# Patient Record
Sex: Female | Born: 1980 | Race: White | Hispanic: No | Marital: Married | Smoking: Never smoker
Health system: Southern US, Community
[De-identification: ages and names within clinical notes are randomized; demographics above are authoritative.]

## PROBLEM LIST (undated history)

## (undated) DIAGNOSIS — J189 Pneumonia, unspecified organism: Secondary | ICD-10-CM

## (undated) DIAGNOSIS — I471 Supraventricular tachycardia, unspecified: Secondary | ICD-10-CM

## (undated) DIAGNOSIS — D239 Other benign neoplasm of skin, unspecified: Secondary | ICD-10-CM

## (undated) DIAGNOSIS — Z8639 Personal history of other endocrine, nutritional and metabolic disease: Secondary | ICD-10-CM

## (undated) HISTORY — PX: LASIK: SHX215

## (undated) HISTORY — PX: TONSILLECTOMY: SUR1361

## (undated) HISTORY — DX: Other benign neoplasm of skin, unspecified: D23.9

## (undated) HISTORY — DX: Supraventricular tachycardia, unspecified: I47.10

## (undated) HISTORY — DX: Personal history of other endocrine, nutritional and metabolic disease: Z86.39

## (undated) HISTORY — PX: BACK SURGERY: SHX140

## (undated) HISTORY — DX: Supraventricular tachycardia: I47.1

---

## 2005-07-25 ENCOUNTER — Emergency Department: Payer: Self-pay | Admitting: Emergency Medicine

## 2005-07-25 ENCOUNTER — Other Ambulatory Visit: Payer: Self-pay

## 2005-08-01 ENCOUNTER — Ambulatory Visit: Payer: Self-pay

## 2005-11-27 ENCOUNTER — Ambulatory Visit: Payer: Self-pay | Admitting: Unknown Physician Specialty

## 2006-01-10 ENCOUNTER — Ambulatory Visit: Payer: Self-pay | Admitting: Obstetrics and Gynecology

## 2006-05-14 ENCOUNTER — Ambulatory Visit: Payer: Self-pay | Admitting: Neurological Surgery

## 2006-06-07 ENCOUNTER — Ambulatory Visit: Payer: Self-pay | Admitting: Obstetrics and Gynecology

## 2006-06-08 ENCOUNTER — Ambulatory Visit: Payer: Self-pay | Admitting: Obstetrics and Gynecology

## 2006-06-09 ENCOUNTER — Ambulatory Visit: Payer: Self-pay | Admitting: Obstetrics and Gynecology

## 2006-06-20 ENCOUNTER — Ambulatory Visit: Payer: Self-pay | Admitting: Obstetrics and Gynecology

## 2006-08-04 ENCOUNTER — Ambulatory Visit: Payer: Self-pay | Admitting: General Practice

## 2006-08-06 ENCOUNTER — Inpatient Hospital Stay: Payer: Self-pay | Admitting: Obstetrics and Gynecology

## 2006-09-08 ENCOUNTER — Ambulatory Visit: Payer: Self-pay | Admitting: General Practice

## 2007-02-14 ENCOUNTER — Encounter: Payer: Self-pay | Admitting: Obstetrics and Gynecology

## 2007-02-19 ENCOUNTER — Ambulatory Visit: Payer: Self-pay | Admitting: Internal Medicine

## 2007-03-21 ENCOUNTER — Encounter: Payer: Self-pay | Admitting: Maternal & Fetal Medicine

## 2007-04-11 ENCOUNTER — Encounter: Payer: Self-pay | Admitting: Maternal & Fetal Medicine

## 2007-04-30 ENCOUNTER — Observation Stay: Payer: Self-pay | Admitting: Obstetrics and Gynecology

## 2007-06-06 ENCOUNTER — Encounter: Payer: Self-pay | Admitting: Maternal & Fetal Medicine

## 2007-08-08 ENCOUNTER — Observation Stay: Payer: Self-pay | Admitting: Obstetrics and Gynecology

## 2007-08-23 ENCOUNTER — Inpatient Hospital Stay: Payer: Self-pay | Admitting: Obstetrics and Gynecology

## 2008-09-15 ENCOUNTER — Emergency Department: Payer: Self-pay | Admitting: Emergency Medicine

## 2009-05-01 ENCOUNTER — Other Ambulatory Visit: Payer: Self-pay | Admitting: Internal Medicine

## 2011-01-20 ENCOUNTER — Other Ambulatory Visit: Payer: Self-pay | Admitting: Emergency Medicine

## 2011-04-24 ENCOUNTER — Inpatient Hospital Stay: Payer: Self-pay

## 2011-12-08 ENCOUNTER — Ambulatory Visit: Payer: Self-pay | Admitting: General Practice

## 2012-08-01 ENCOUNTER — Ambulatory Visit: Payer: Self-pay | Admitting: General Practice

## 2013-04-03 LAB — HM PAP SMEAR: HM Pap smear: NEGATIVE

## 2014-05-11 ENCOUNTER — Ambulatory Visit: Payer: Self-pay | Admitting: Family Medicine

## 2014-05-12 ENCOUNTER — Other Ambulatory Visit: Payer: Self-pay | Admitting: Physician Assistant

## 2014-05-12 LAB — CBC WITH DIFFERENTIAL/PLATELET
Basophil #: 0 10*3/uL (ref 0.0–0.1)
Basophil %: 0.4 %
Eosinophil #: 0.1 10*3/uL (ref 0.0–0.7)
Eosinophil %: 1.7 %
HCT: 40.2 % (ref 35.0–47.0)
HGB: 13.3 g/dL (ref 12.0–16.0)
LYMPHS ABS: 1.7 10*3/uL (ref 1.0–3.6)
Lymphocyte %: 25 %
MCH: 31.8 pg (ref 26.0–34.0)
MCHC: 33.1 g/dL (ref 32.0–36.0)
MCV: 96 fL (ref 80–100)
MONO ABS: 0.6 x10 3/mm (ref 0.2–0.9)
Monocyte %: 8.3 %
NEUTROS ABS: 4.3 10*3/uL (ref 1.4–6.5)
Neutrophil %: 64.6 %
Platelet: 219 10*3/uL (ref 150–440)
RBC: 4.18 10*6/uL (ref 3.80–5.20)
RDW: 12.7 % (ref 11.5–14.5)
WBC: 6.7 10*3/uL (ref 3.6–11.0)

## 2014-05-12 LAB — HCG, QUANTITATIVE, PREGNANCY: Beta Hcg, Quant.: <1 m[IU]/mL — ABNORMAL LOW

## 2014-06-23 HISTORY — PX: DILATION AND CURETTAGE OF UTERUS: SHX78

## 2014-06-24 ENCOUNTER — Ambulatory Visit: Payer: Self-pay | Admitting: Obstetrics and Gynecology

## 2014-06-24 LAB — CBC
HCT: 39.1 % (ref 35.0–47.0)
HGB: 13 g/dL (ref 12.0–16.0)
MCH: 32 pg (ref 26.0–34.0)
MCHC: 33.4 g/dL (ref 32.0–36.0)
MCV: 96 fL (ref 80–100)
Platelet: 207 10*3/uL (ref 150–440)
RBC: 4.08 10*6/uL (ref 3.80–5.20)
RDW: 12.8 % (ref 11.5–14.5)
WBC: 7 10*3/uL (ref 3.6–11.0)

## 2014-06-26 LAB — PATHOLOGY REPORT

## 2014-10-23 NOTE — L&D Delivery Note (Cosign Needed)
Delivery Note At  a viable female was delivered via  (Presentation:LOA;  ).  APGAR:8 ,9 ; weight  .   Placenta status:delivered intact spontneously with Nuchal Cord x3:  with the following complications:none .  Cord pH: NA  Anesthesia:  none Episiotomy:  none Lacerations:  none Suture Repair: NA Est. Blood Loss (mL):    Mom to postpartum.  Baby to Couplet care / Skin to Skin.  Melody Burr 06/21/2015, 10:35 AM

## 2014-11-23 LAB — OB RESULTS CONSOLE HIV ANTIBODY (ROUTINE TESTING): HIV: NONREACTIVE

## 2014-11-23 LAB — OB RESULTS CONSOLE RPR: RPR: NONREACTIVE

## 2014-11-23 LAB — OB RESULTS CONSOLE VARICELLA ZOSTER ANTIBODY, IGG: Varicella: IMMUNE

## 2014-11-23 LAB — OB RESULTS CONSOLE ABO/RH: RH TYPE: POSITIVE

## 2014-11-23 LAB — OB RESULTS CONSOLE PLATELET COUNT: PLATELETS: 204 10*3/uL

## 2014-11-23 LAB — OB RESULTS CONSOLE HGB/HCT, BLOOD
HCT: 38 %
Hemoglobin: 12.6 g/dL

## 2014-11-23 LAB — OB RESULTS CONSOLE ANTIBODY SCREEN: Antibody Screen: NEGATIVE

## 2014-11-23 LAB — OB RESULTS CONSOLE GC/CHLAMYDIA
CHLAMYDIA, DNA PROBE: NEGATIVE
Gonorrhea: NEGATIVE

## 2014-11-23 LAB — OB RESULTS CONSOLE RUBELLA ANTIBODY, IGM: RUBELLA: IMMUNE

## 2015-01-18 ENCOUNTER — Encounter: Payer: Self-pay | Admitting: *Deleted

## 2015-01-18 ENCOUNTER — Encounter (INDEPENDENT_AMBULATORY_CARE_PROVIDER_SITE_OTHER): Payer: Self-pay

## 2015-01-18 ENCOUNTER — Encounter: Payer: Self-pay | Admitting: Cardiovascular Disease

## 2015-01-18 ENCOUNTER — Ambulatory Visit (INDEPENDENT_AMBULATORY_CARE_PROVIDER_SITE_OTHER): Payer: 59 | Admitting: Cardiovascular Disease

## 2015-01-18 VITALS — BP 103/66 | HR 74 | Ht 63.0 in | Wt 140.2 lb

## 2015-01-18 DIAGNOSIS — I471 Supraventricular tachycardia: Secondary | ICD-10-CM

## 2015-01-18 NOTE — Patient Instructions (Signed)
Your physician has requested that you have an echocardiogram. Echocardiography is a painless test that uses sound waves to create images of your heart. It provides your doctor with information about the size and shape of your heart and how well your heart's chambers and valves are working. This procedure takes approximately one hour. There are no restrictions for this procedure.  Follow up as needed 

## 2015-01-18 NOTE — Assessment & Plan Note (Signed)
She has known history of paroxysmal supraventricular tachycardia. However, the clinical burden overall seems to be low. Thus, I do not think therapy is warranted at the present time especially that most of her episodes respond to vagal maneuvers. I requested an echocardiogram to ensure no structural heart abnormalities. If SVT becomes more frequent requiring interventions, then treatment with a beta blocker can be considered as a last resort during pregnancy. Catheter ablation can be considered in the future again if the burden is more significant.

## 2015-01-18 NOTE — Progress Notes (Signed)
   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 She is referred for evaluation of paroxysmal supraventricular tachycardia. She reports prolonged history of SVT since 2007. She required adenosine on one time and no previous cardioversions. She can usually identify when she goes in SVT and in the majority of the time she is able to terminate the episodes with vagal maneuvers. These episodes usually gets worse when she is pregnant. She is currently [redacted] weeks pregnant. When she goes in SVT, the heart rate is usually at least 180 bpm. Otherwise she denies any chest pain or shortness of breath. She is not a smoker and does not drink alcohol. Family history is negative for premature coronary artery disease or arrhythmia.  Allergies  Allergen Reactions  . Latex      No current outpatient prescriptions on file prior to visit.   No current facility-administered medications on file prior to visit.     Past Medical History  Diagnosis Date  . Hx of hypercholesterolemia   . SVT (supraventricular tachycardia)      Past Surgical History  Procedure Laterality Date  . Back surgery    . Tonsillectomy       Family History  Problem Relation Age of Onset  . Hypertension Father   . Hyperlipidemia Father      History   Social History  . Marital Status: Married    Spouse Name: N/A  . Number of Children: N/A  . Years of Education: N/A   Occupational History  . Not on file.   Social History Main Topics  . Smoking status: Never Smoker   . Smokeless tobacco: Not on file  . Alcohol Use: No  . Drug Use: No  . Sexual Activity: Not on file   Other Topics Concern  . Not on file   Social History Narrative     ROS A 10 point review of system was performed. It is negative other than that mentioned in the history of present illness.   PHYSICAL EXAM   BP 103/66 mmHg  Pulse 74  Ht 5\' 3"  (1.6 m)  Wt 140 lb 4 oz (63.617 kg)  BMI 24.85  kg/m2 Constitutional: She is oriented to person, place, and time. She appears well-developed and well-nourished. No distress.  HENT: No nasal discharge.  Head: Normocephalic and atraumatic.  Eyes: Pupils are equal and round. No discharge.  Neck: Normal range of motion. Neck supple. No JVD present. No thyromegaly present.  Cardiovascular: Normal rate, regular rhythm, normal heart sounds. Exam reveals no gallop and no friction rub. No murmur heard.  Pulmonary/Chest: Effort normal and breath sounds normal. No stridor. No respiratory distress. She has no wheezes. She has no rales. She exhibits no tenderness.  Abdominal: Soft. Bowel sounds are normal. She exhibits no distension. There is no tenderness. There is no rebound and no guarding.  Musculoskeletal: Normal range of motion. She exhibits no edema and no tenderness.  Neurological: She is alert and oriented to person, place, and time. Coordination normal.  Skin: Skin is warm and dry. No rash noted. She is not diaphoretic. No erythema. No pallor.  Psychiatric: She has a normal mood and affect. Her behavior is normal. Judgment and thought content normal.     RFF:MBWGY  Rhythm  -Short PR  PRi = 116 Low voltage in precordial leads.   ABNORMAL    ASSESSMENT AND PLAN

## 2015-02-04 ENCOUNTER — Other Ambulatory Visit (INDEPENDENT_AMBULATORY_CARE_PROVIDER_SITE_OTHER): Payer: 59

## 2015-02-04 ENCOUNTER — Other Ambulatory Visit: Payer: Self-pay

## 2015-02-04 DIAGNOSIS — I471 Supraventricular tachycardia: Secondary | ICD-10-CM | POA: Diagnosis not present

## 2015-02-11 ENCOUNTER — Telehealth: Payer: Self-pay | Admitting: *Deleted

## 2015-02-11 NOTE — Telephone Encounter (Signed)
I spoke with the patient. 

## 2015-02-11 NOTE — Telephone Encounter (Signed)
Pt returning our call for results on test she did

## 2015-02-13 NOTE — Op Note (Signed)
PATIENT NAME:  KAMORA, VOSSLER MR#:  [id number] DATE OF BIRTH:  1981-10-23  DATE OF PROCEDURE:  06/24/2014  PREOPERATIVE DIAGNOSES:  1.  Missed abortion.  2.  Rh+ blood type.   POSTOPERATIVE DIAGNOSES: 1.  Missed abortion. 2.  Rh+ blood type.  OPERATIVE PROCEDURE: Suction dilatation and curettage.   SURGEON: Alanda Slim. Jaelyne Deeg, MD  FIRST ASSISTANT: None.   ANESTHESIA: General LMA.   INDICATIONS: The patient is a 34 year old married white female gravida 4, para 2 0-1-2 at 6+ weeks' gestation who presents for surgical management of missed abortion.   FINDINGS AT SURGERY: Revealed an anteverted uterus of top normal size. Uterus sounded to 9 cm. Tissue consistent with products of conception was removed.   DESCRIPTION OF PROCEDURE: The patient was brought to the operating room where she was placed in the supine position. General anesthesia with LMA was induced without difficulty. She was placed in the dorsal lithotomy position using the bumblebee stirrups. A Betadine abdominal, perineal, intravaginal prep and drape was performed in standard fashion. A latex free Foley catheter was used to drain 50 mL of urine from the bladder. A weighted speculum was placed into the vagina. A single-tooth tenaculum was placed the anterior lip of the cervix. The uterus was sounded to 9 cm. Hanks dilators were used, up to a #20 [place] caliber, to dilate the endocervical canal. This was followed by the use of a 10 French suction curette to remove tissue from the intrauterine cavity. Suction curettage was performed in standard technique with several passes. A sharp curette was likewise used following the suction curettage to verify that no significant tissue was left behind. Upon completion of the procedure, all instrumentation was removed from the vagina. The patient was then awakened, mobilized, and taken to the recovery room in satisfactory condition.   ESTIMATED BLOOD LOSS: 50 mL.   INTRAVENOUS FLUIDS: 450  mL.   URINE OUTPUT: 50 mL.   ____________________________ Alanda Slim. Tyiana Hill, MD mad:TT D: 06/24/2014 14:00:54 ET T: 06/24/2014 21:38:40 ET JOB#: [id number]  cc: Hassell Done A. Joie Reamer, MD, <Dictator> [facility] MD ELECTRONICALLY SIGNED 07/02/2014 8:07

## 2015-03-24 ENCOUNTER — Other Ambulatory Visit: Payer: Self-pay | Admitting: Obstetrics and Gynecology

## 2015-03-24 DIAGNOSIS — IMO0001 Reserved for inherently not codable concepts without codable children: Secondary | ICD-10-CM

## 2015-03-24 DIAGNOSIS — O358XX1 Maternal care for other (suspected) fetal abnormality and damage, fetus 1: Secondary | ICD-10-CM

## 2015-03-30 ENCOUNTER — Encounter: Payer: Self-pay | Admitting: *Deleted

## 2015-03-31 ENCOUNTER — Ambulatory Visit (INDEPENDENT_AMBULATORY_CARE_PROVIDER_SITE_OTHER): Payer: 59 | Admitting: Obstetrics and Gynecology

## 2015-03-31 ENCOUNTER — Ambulatory Visit: Payer: 59

## 2015-03-31 VITALS — BP 110/61 | HR 72 | Wt 152.8 lb

## 2015-03-31 DIAGNOSIS — O358XX1 Maternal care for other (suspected) fetal abnormality and damage, fetus 1: Secondary | ICD-10-CM

## 2015-03-31 DIAGNOSIS — Z3492 Encounter for supervision of normal pregnancy, unspecified, second trimester: Secondary | ICD-10-CM

## 2015-03-31 DIAGNOSIS — Z3482 Encounter for supervision of other normal pregnancy, second trimester: Secondary | ICD-10-CM

## 2015-03-31 DIAGNOSIS — Z131 Encounter for screening for diabetes mellitus: Secondary | ICD-10-CM | POA: Diagnosis not present

## 2015-03-31 DIAGNOSIS — Z23 Encounter for immunization: Secondary | ICD-10-CM

## 2015-03-31 DIAGNOSIS — IMO0001 Reserved for inherently not codable concepts without codable children: Secondary | ICD-10-CM

## 2015-03-31 LAB — POCT URINALYSIS DIPSTICK
Bilirubin, UA: NEGATIVE
Glucose, UA: NEGATIVE
Ketones, UA: NEGATIVE
LEUKOCYTES UA: NEGATIVE
NITRITE UA: NEGATIVE
PROTEIN UA: NEGATIVE
RBC UA: NEGATIVE
UROBILINOGEN UA: 0.2
pH, UA: 7.5

## 2015-03-31 NOTE — Progress Notes (Signed)
ROB & F/U U/S for reassess fetal kidneys- doing well w/o complaint; Glucola done, Tdap given; Blood consent signed; u/s reveals EFW 49%, with adequate AFI 13.3cm, rt kidney 5.3 and left kidney 6.9 (upper normal is 7.0 in 3rd trimester); reviewed ith patient, will alert pediatrician at delivery.

## 2015-03-31 NOTE — Patient Instructions (Signed)
  Place 24-38 weeks prenatal visit patient instructions here.

## 2015-04-01 LAB — HEMOGLOBIN AND HEMATOCRIT, BLOOD
HEMATOCRIT: 34.2 % (ref 34.0–46.6)
Hemoglobin: 11.4 g/dL (ref 11.1–15.9)

## 2015-04-01 LAB — GLUCOSE, 1 HOUR GESTATIONAL: Gestational Diabetes Screen: 121 mg/dL (ref 65–139)

## 2015-04-01 NOTE — Progress Notes (Signed)
Quick Note:  Please let her know she passed her glucola and no signs of anemia ______

## 2015-04-20 ENCOUNTER — Ambulatory Visit (INDEPENDENT_AMBULATORY_CARE_PROVIDER_SITE_OTHER): Payer: 59 | Admitting: Obstetrics and Gynecology

## 2015-04-20 ENCOUNTER — Encounter: Payer: Self-pay | Admitting: Obstetrics and Gynecology

## 2015-04-20 VITALS — BP 106/64 | HR 77 | Wt 155.5 lb

## 2015-04-20 DIAGNOSIS — Z3493 Encounter for supervision of normal pregnancy, unspecified, third trimester: Secondary | ICD-10-CM

## 2015-04-20 LAB — POCT URINALYSIS DIPSTICK
Bilirubin, UA: NEGATIVE
Glucose, UA: NEGATIVE
Ketones, UA: NEGATIVE
Leukocytes, UA: NEGATIVE
Nitrite, UA: NEGATIVE
Protein, UA: NEGATIVE
Urobilinogen, UA: 0.2
pH, UA: 7

## 2015-04-20 NOTE — Progress Notes (Signed)
[place], L hip pain

## 2015-04-20 NOTE — Progress Notes (Signed)
ROB- doing well, some soreness in left hip after falling out of bed on it 6 days ago but denies any other concerns; plans HC PP, plans breast-feeding x 6 weeks then switch to bottle as she has done with other infants; plans to name her "reagan".

## 2015-05-11 ENCOUNTER — Telehealth: Payer: Self-pay | Admitting: Obstetrics and Gynecology

## 2015-05-11 ENCOUNTER — Ambulatory Visit (INDEPENDENT_AMBULATORY_CARE_PROVIDER_SITE_OTHER): Payer: 59 | Admitting: Obstetrics and Gynecology

## 2015-05-11 ENCOUNTER — Encounter: Payer: Self-pay | Admitting: Obstetrics and Gynecology

## 2015-05-11 VITALS — BP 104/68 | HR 86 | Wt 157.5 lb

## 2015-05-11 DIAGNOSIS — Z3493 Encounter for supervision of normal pregnancy, unspecified, third trimester: Secondary | ICD-10-CM

## 2015-05-11 LAB — POCT URINALYSIS DIPSTICK
Bilirubin, UA: NEGATIVE
GLUCOSE UA: NEGATIVE
Ketones, UA: NEGATIVE
LEUKOCYTES UA: NEGATIVE
Nitrite, UA: NEGATIVE
PROTEIN UA: NEGATIVE
RBC UA: NEGATIVE
SPEC GRAV UA: 1.01
Urobilinogen, UA: 0.2
pH, UA: 6

## 2015-05-11 NOTE — Telephone Encounter (Signed)
Notified pt i spoke with MNB totally normal due to being checked today, pt voiced understanding

## 2015-05-11 NOTE — Progress Notes (Signed)
ROB- reassured about [facility] and cervical effects to date- PTL precautions reiterated. Cultures next visit.

## 2015-05-11 NOTE — Progress Notes (Signed)
Pt is having some braxton hicks, coming more frequent and is feeling lots of pelvic pressure States they can be so intense co workers are noticing

## 2015-05-11 NOTE — Telephone Encounter (Signed)
SP[place] .Marland KitchenMarland Kitchen [place]

## 2015-05-26 ENCOUNTER — Ambulatory Visit (INDEPENDENT_AMBULATORY_CARE_PROVIDER_SITE_OTHER): Payer: 59 | Admitting: Obstetrics and Gynecology

## 2015-05-26 ENCOUNTER — Encounter: Payer: Self-pay | Admitting: Obstetrics and Gynecology

## 2015-05-26 VITALS — BP 105/75 | HR 90 | Wt 162.3 lb

## 2015-05-26 DIAGNOSIS — Z3685 Encounter for antenatal screening for Streptococcus B: Secondary | ICD-10-CM

## 2015-05-26 DIAGNOSIS — Z113 Encounter for screening for infections with a predominantly sexual mode of transmission: Secondary | ICD-10-CM

## 2015-05-26 DIAGNOSIS — Z3493 Encounter for supervision of normal pregnancy, unspecified, third trimester: Secondary | ICD-10-CM

## 2015-05-26 DIAGNOSIS — Z36 Encounter for antenatal screening of mother: Secondary | ICD-10-CM

## 2015-05-26 LAB — POCT URINALYSIS DIPSTICK
BILIRUBIN UA: NEGATIVE
GLUCOSE UA: NEGATIVE
Ketones, UA: NEGATIVE
LEUKOCYTES UA: NEGATIVE
NITRITE UA: NEGATIVE
RBC UA: NEGATIVE
Spec Grav, UA: 1.01
Urobilinogen, UA: 0.2
pH, UA: 7

## 2015-05-26 NOTE — Progress Notes (Signed)
ROB- cultures obtained;no change from last visit.

## 2015-05-26 NOTE — Progress Notes (Signed)
Pt is still having some contractions, lots of pressure

## 2015-05-28 ENCOUNTER — Other Ambulatory Visit: Payer: Self-pay | Admitting: Obstetrics and Gynecology

## 2015-05-28 DIAGNOSIS — B951 Streptococcus, group B, as the cause of diseases classified elsewhere: Secondary | ICD-10-CM

## 2015-05-28 LAB — GC/CHLAMYDIA PROBE AMP
Chlamydia trachomatis, NAA: NEGATIVE
NEISSERIA GONORRHOEAE BY PCR: NEGATIVE

## 2015-05-28 LAB — STREP GP B NAA: STREP GROUP B AG: POSITIVE — AB

## 2015-06-01 ENCOUNTER — Encounter: Payer: Self-pay | Admitting: Obstetrics and Gynecology

## 2015-06-01 ENCOUNTER — Ambulatory Visit (INDEPENDENT_AMBULATORY_CARE_PROVIDER_SITE_OTHER): Payer: 59 | Admitting: Obstetrics and Gynecology

## 2015-06-01 VITALS — BP 99/69 | HR 88 | Wt 162.8 lb

## 2015-06-01 DIAGNOSIS — Z3493 Encounter for supervision of normal pregnancy, unspecified, third trimester: Secondary | ICD-10-CM

## 2015-06-01 LAB — POCT URINALYSIS DIPSTICK
Bilirubin, UA: NEGATIVE
Blood, UA: NEGATIVE
Glucose, UA: NEGATIVE
KETONES UA: NEGATIVE
Nitrite, UA: NEGATIVE
Protein, UA: NEGATIVE
SPEC GRAV UA: 1.01
Urobilinogen, UA: 0.2
pH, UA: 6.5

## 2015-06-01 NOTE — Progress Notes (Signed)
ROB- reviewed GBS+ and antibiotic use in labor- no questions,

## 2015-06-01 NOTE — Progress Notes (Signed)
ROB- having some contractions, pelvic pressure

## 2015-06-09 ENCOUNTER — Encounter: Payer: Self-pay | Admitting: Obstetrics and Gynecology

## 2015-06-09 ENCOUNTER — Ambulatory Visit (INDEPENDENT_AMBULATORY_CARE_PROVIDER_SITE_OTHER): Payer: 59 | Admitting: Obstetrics and Gynecology

## 2015-06-09 VITALS — BP 108/64 | HR 76 | Wt 163.0 lb

## 2015-06-09 DIAGNOSIS — Z3493 Encounter for supervision of normal pregnancy, unspecified, third trimester: Secondary | ICD-10-CM

## 2015-06-09 LAB — POCT URINALYSIS DIPSTICK
Bilirubin, UA: NEGATIVE
Blood, UA: NEGATIVE
Glucose, UA: NEGATIVE
KETONES UA: NEGATIVE
Leukocytes, UA: NEGATIVE
Nitrite, UA: NEGATIVE
PROTEIN UA: NEGATIVE
SPEC GRAV UA: 1.01
Urobilinogen, UA: 0.2
pH, UA: 6

## 2015-06-09 NOTE — Progress Notes (Signed)
ROB- pt is having some contractions, lots of pelvic pressure

## 2015-06-09 NOTE — Progress Notes (Signed)
ROB- not sleeping well, irreg [place], that are more painful at times; discussed cord blood donation and info given to review.

## 2015-06-15 ENCOUNTER — Encounter: Payer: Self-pay | Admitting: Obstetrics and Gynecology

## 2015-06-15 ENCOUNTER — Ambulatory Visit (INDEPENDENT_AMBULATORY_CARE_PROVIDER_SITE_OTHER): Payer: 59 | Admitting: Obstetrics and Gynecology

## 2015-06-15 VITALS — BP 106/62 | HR 82 | Wt 165.4 lb

## 2015-06-15 DIAGNOSIS — Z331 Pregnant state, incidental: Secondary | ICD-10-CM

## 2015-06-15 LAB — POCT URINALYSIS DIPSTICK
Bilirubin, UA: NEGATIVE
GLUCOSE UA: NEGATIVE
Ketones, UA: NEGATIVE
Leukocytes, UA: NEGATIVE
NITRITE UA: NEGATIVE
PH UA: 6.5
Protein, UA: NEGATIVE
RBC UA: NEGATIVE
Spec Grav, UA: 1.01
UROBILINOGEN UA: 0.2

## 2015-06-15 NOTE — Progress Notes (Signed)
ROB- labor precautions reiterated, scheduled for IOL 8/31 at 06:30a if not delivered by then, patient aware

## 2015-06-15 NOTE — Progress Notes (Signed)
ROB-contractions all night, pelvic pressure

## 2015-06-21 ENCOUNTER — Encounter: Payer: Self-pay | Admitting: *Deleted

## 2015-06-21 ENCOUNTER — Inpatient Hospital Stay
Admission: EM | Admit: 2015-06-21 | Discharge: 2015-06-23 | DRG: 775 | Disposition: A | Discharge status: Home / Self Care | Payer: 59 | Attending: Obstetrics and Gynecology | Admitting: Obstetrics and Gynecology

## 2015-06-21 DIAGNOSIS — Z3A39 39 weeks gestation of pregnancy: Secondary | ICD-10-CM | POA: Diagnosis present

## 2015-06-21 DIAGNOSIS — O99824 Streptococcus B carrier state complicating childbirth: Secondary | ICD-10-CM | POA: Diagnosis present

## 2015-06-21 DIAGNOSIS — Z8249 Family history of ischemic heart disease and other diseases of the circulatory system: Secondary | ICD-10-CM

## 2015-06-21 DIAGNOSIS — Z3493 Encounter for supervision of normal pregnancy, unspecified, third trimester: Secondary | ICD-10-CM

## 2015-06-21 DIAGNOSIS — Z8349 Family history of other endocrine, nutritional and metabolic diseases: Secondary | ICD-10-CM

## 2015-06-21 DIAGNOSIS — O479 False labor, unspecified: Secondary | ICD-10-CM | POA: Diagnosis present

## 2015-06-21 DIAGNOSIS — Z3490 Encounter for supervision of normal pregnancy, unspecified, unspecified trimester: Secondary | ICD-10-CM

## 2015-06-21 LAB — ABO/RH: ABO/RH(D): O POS

## 2015-06-21 LAB — CBC
HCT: 38 % (ref 35.0–47.0)
HEMOGLOBIN: 12.9 g/dL (ref 12.0–16.0)
MCH: 32.1 pg (ref 26.0–34.0)
MCHC: 34 g/dL (ref 32.0–36.0)
MCV: 94.3 fL (ref 80.0–100.0)
Platelets: 221 10*3/uL (ref 150–440)
RBC: 4.03 MIL/uL (ref 3.80–5.20)
RDW: 13.3 % (ref 11.5–14.5)
WBC: 14.2 10*3/uL — ABNORMAL HIGH (ref 3.6–11.0)

## 2015-06-21 LAB — TYPE AND SCREEN
ABO/RH(D): O POS
ANTIBODY SCREEN: NEGATIVE

## 2015-06-21 MED ORDER — BENZOCAINE-MENTHOL 20-0.5 % EX AERO: 1.0000 "application " | INHALATION_SPRAY | CUTANEOUS | Status: DC | PRN

## 2015-06-21 MED ORDER — SENNOSIDES-DOCUSATE SODIUM 8.6-50 MG PO TABS
2.0000 | ORAL_TABLET | ORAL | Status: DC
  Administered 2015-06-21: 2 via ORAL
  Filled 2015-06-21: qty 2

## 2015-06-21 MED ORDER — OXYCODONE-ACETAMINOPHEN 5-325 MG PO TABS
1.0000 | ORAL_TABLET | ORAL | Status: DC | PRN
  Administered 2015-06-21: 1 via ORAL
  Filled 2015-06-21: qty 1

## 2015-06-21 MED ORDER — OXYTOCIN 40 UNITS IN LACTATED RINGERS INFUSION - SIMPLE MED
62.5000 mL/h | INTRAVENOUS | Status: DC
  Administered 2015-06-21: 62.5 mL/h via INTRAVENOUS

## 2015-06-21 MED ORDER — LIDOCAINE HCL (PF) 1 % IJ SOLN
30.0000 mL | INTRAMUSCULAR | Status: DC | PRN
  Filled 2015-06-21: qty 30

## 2015-06-21 MED ORDER — LANOLIN HYDROUS EX OINT
TOPICAL_OINTMENT | CUTANEOUS | Status: DC | PRN
Start: 2015-06-21 — End: 2015-06-23

## 2015-06-21 MED ORDER — ACETAMINOPHEN 325 MG PO TABS: 650.0000 mg | ORAL_TABLET | ORAL | Status: DC | PRN

## 2015-06-21 MED ORDER — OXYTOCIN 40 UNITS IN LACTATED RINGERS INFUSION - SIMPLE MED
INTRAVENOUS | Status: AC
  Administered 2015-06-21: 62.5 mL/h via INTRAVENOUS
  Filled 2015-06-21: qty 1000

## 2015-06-21 MED ORDER — SIMETHICONE 80 MG PO CHEW: 80.0000 mg | CHEWABLE_TABLET | ORAL | Status: DC | PRN

## 2015-06-21 MED ORDER — SODIUM CHLORIDE 0.9 % IV SOLN
1.0000 g | INTRAVENOUS | Status: DC
  Filled 2015-06-21 (×7): qty 1000

## 2015-06-21 MED ORDER — OXYTOCIN 40 UNITS IN LACTATED RINGERS INFUSION - SIMPLE MED: 62.5000 mL/h | INTRAVENOUS | Status: DC | PRN

## 2015-06-21 MED ORDER — DIBUCAINE 1 % RE OINT
1.0000 "application " | TOPICAL_OINTMENT | RECTAL | Status: DC | PRN
  Administered 2015-06-21: 1 via RECTAL
  Filled 2015-06-21: qty 28

## 2015-06-21 MED ORDER — OXYCODONE-ACETAMINOPHEN 5-325 MG PO TABS: 2.0000 | ORAL_TABLET | ORAL | Status: DC | PRN

## 2015-06-21 MED ORDER — OXYCODONE-ACETAMINOPHEN 5-325 MG PO TABS: 1.0000 | ORAL_TABLET | ORAL | Status: DC | PRN

## 2015-06-21 MED ORDER — ONDANSETRON HCL 4 MG PO TABS: 4.0000 mg | ORAL_TABLET | ORAL | Status: DC | PRN

## 2015-06-21 MED ORDER — ONDANSETRON HCL 4 MG/2ML IJ SOLN: 4.0000 mg | Freq: Four times a day (QID) | INTRAMUSCULAR | Status: DC | PRN

## 2015-06-21 MED ORDER — LACTATED RINGERS IV SOLN
INTRAVENOUS | Status: DC
  Administered 2015-06-21: 11:00:00 via INTRAVENOUS

## 2015-06-21 MED ORDER — PRENATAL MULTIVITAMIN CH
1.0000 | ORAL_TABLET | Freq: Every day | ORAL | Status: DC
  Administered 2015-06-22: 1 via ORAL
  Filled 2015-06-21: qty 1

## 2015-06-21 MED ORDER — WITCH HAZEL-GLYCERIN EX PADS: 1.0000 "application " | MEDICATED_PAD | CUTANEOUS | Status: DC | PRN

## 2015-06-21 MED ORDER — SODIUM CHLORIDE 0.9 % IV SOLN
INTRAVENOUS | Status: AC
  Administered 2015-06-21: 2 g via INTRAVENOUS
  Filled 2015-06-21: qty 2000

## 2015-06-21 MED ORDER — CITRIC ACID-SODIUM CITRATE 334-500 MG/5ML PO SOLN: 30.0000 mL | ORAL | Status: DC | PRN

## 2015-06-21 MED ORDER — OXYTOCIN BOLUS FROM INFUSION
500.0000 mL | INTRAVENOUS | Status: DC
  Administered 2015-06-21: 500 mL via INTRAVENOUS

## 2015-06-21 MED ORDER — DIPHENHYDRAMINE HCL 25 MG PO CAPS: 25.0000 mg | ORAL_CAPSULE | Freq: Four times a day (QID) | ORAL | Status: DC | PRN

## 2015-06-21 MED ORDER — IBUPROFEN 600 MG PO TABS: 600.0000 mg | ORAL_TABLET | Freq: Four times a day (QID) | ORAL | Status: DC

## 2015-06-21 MED ORDER — FENTANYL CITRATE (PF) 100 MCG/2ML IJ SOLN: 50.0000 ug | INTRAMUSCULAR | Status: DC | PRN

## 2015-06-21 MED ORDER — ONDANSETRON HCL 4 MG/2ML IJ SOLN: 4.0000 mg | INTRAMUSCULAR | Status: DC | PRN

## 2015-06-21 MED ORDER — LACTATED RINGERS IV SOLN: 500.0000 mL | INTRAVENOUS | Status: DC | PRN

## 2015-06-21 MED ORDER — IBUPROFEN 800 MG PO TABS
800.0000 mg | ORAL_TABLET | Freq: Three times a day (TID) | ORAL | Status: DC
  Administered 2015-06-21 – 2015-06-23 (×5): 800 mg via ORAL
  Filled 2015-06-21 (×5): qty 1

## 2015-06-21 MED ORDER — SODIUM CHLORIDE 0.9 % IV SOLN
2.0000 g | Freq: Once | INTRAVENOUS | Status: AC
  Administered 2015-06-21: 2 g via INTRAVENOUS

## 2015-06-21 NOTE — H&P (Signed)
Obstetric History and Physical  LADAISHA PORTILLO is a 34 y.o. [id number] with IUP at [redacted]w[redacted]d presenting with contractions since 0330am. Patient states she has been having  regular, every 4 minutes contractions, minimal vaginal bleeding, intact membranes, with active fetal movement.    Prenatal Course Source of Care: [facility]  Pregnancy complications or risks:GBS +. H/O SVT  Prenatal labs and studies: ABO, Rh: O/Positive/-- (02/01 0000) Antibody: Negative (02/01 0000) Rubella: Immune (02/01 0000) RPR: Nonreactive (02/01 0000)  HBsAg:    HIV: Non-reactive (02/01 0000)  GBS:  1 hr Glucola  normal Genetic screening normal Anatomy US normal  Past Medical History  Diagnosis Date  . Hx of hypercholesterolemia   . SVT (supraventricular tachycardia)   . Abnormal fetal ultrasound     Past Surgical History  Procedure Laterality Date  . Back surgery    . Tonsillectomy    . Dilation and curettage of uterus  9 2015    OB History  Gravida Para Term Preterm AB SAB TAB Ectopic Multiple Living  5 1  1 1 1    2     # Outcome Date GA Lbr Len/2nd Weight Sex Delivery Anes PTL Lv  5 Current           4 Gravida 04/24/11    F Vag-Spont   Y  3 Gravida 08/24/07    M Vag-Spont   Y  2 Preterm 2007     Vag-Spont   FD  1 SAB               Social History   Social History  . Marital Status: Married    Spouse Name: N/A  . Number of Children: N/A  . Years of Education: N/A   Social History Main Topics  . Smoking status: Never Smoker   . Smokeless tobacco: Never Used  . Alcohol Use: No  . Drug Use: No  . Sexual Activity: Yes    Birth Control/ Protection: None   Other Topics Concern  . None   Social History Narrative    Family History  Problem Relation Age of Onset  . Hypertension Father   . Hyperlipidemia Father     Prescriptions prior to admission  Medication Sig Dispense Refill Last Dose  . Prenatal Vit-Fe Fumarate-FA (PRENATAL MULTIVITAMIN) TABS tablet Take 1 tablet by mouth daily  at 12 noon.   Taking    Allergies  Allergen Reactions  . Latex     Review of Systems: Negative except for what is mentioned in HPI.  Physical Exam: BP 117/73 mmHg  Pulse 104  Temp(Src) 98.1 F (36.7 C) (Oral)  Resp 22  LMP 09/17/2014 (LMP Unknown) GENERAL: Well-developed, well-nourished female in no acute distress.  LUNGS: Clear to auscultation bilaterally.  HEART: Regular rate and rhythm. ABDOMEN: Soft, nontender, nondistended, gravid. EXTREMITIES: Nontender, no edema, 2+ distal pulses. Cervical Exam:8/100/-1 with bulging bag;  FHT:  Baseline rate 145 bpm   Variability moderate  Accelerations present   Decelerations none Contractions: Every 4 mins   Pertinent Labs/Studies:   Results for orders placed or performed during the hospital encounter of 06/21/15 (from the past 24 hour(s))  CBC     Status: Abnormal   Collection Time: 06/21/15  9:10 AM  Result Value Ref Range   WBC 14.2 (H) 3.6 - 11.0 K/uL   RBC 4.03 3.80 - 5.20 MIL/uL   Hemoglobin 12.9 12.0 - 16.0 g/dL   HCT 38.0 35.0 - 47.0 %   MCV 94.3 80.0 - 100.0  fL   MCH 32.1 26.0 - 34.0 pg   MCHC 34.0 32.0 - 36.0 g/dL   RDW 13.3 11.5 - 14.5 %   Platelets 221 150 - 440 K/uL    Assessment : REED EIFERT is a 34 y.o. [id number] at [redacted]w[redacted]d being admitted for labor.  Plan: Labor: Expectant management.  Induction/Augmentation as needed, per protocol; 1st dose ampicillin infused; patient aware will not meet 4 hour window for full coverage.  FWB: Reassuring fetal heart tracing.  GBS positive Delivery plan: Hopeful for vaginal delivery  Melody Trudee Kuster, CNM [facility], [facility]

## 2015-06-22 ENCOUNTER — Encounter: Payer: 59 | Admitting: Obstetrics and Gynecology

## 2015-06-22 LAB — CBC
HCT: 37.3 % (ref 35.0–47.0)
HEMOGLOBIN: 12.4 g/dL (ref 12.0–16.0)
MCH: 31.9 pg (ref 26.0–34.0)
MCHC: 33.3 g/dL (ref 32.0–36.0)
MCV: 95.7 fL (ref 80.0–100.0)
Platelets: 225 10*3/uL (ref 150–440)
RBC: 3.9 MIL/uL (ref 3.80–5.20)
RDW: 13.2 % (ref 11.5–14.5)
WBC: 13.1 10*3/uL — AB (ref 3.6–11.0)

## 2015-06-22 LAB — RPR: RPR: NONREACTIVE

## 2015-06-22 NOTE — Progress Notes (Signed)
Post Partum Day 1 Subjective: no complaints, up ad lib and voiding  Objective: Blood pressure 98/52, pulse 66, temperature 98 F (36.7 C), temperature source Oral, resp. rate 18, height 5\' 3"  (1.6 m), weight 74.844 kg (165 lb), last menstrual period 09/17/2014, SpO2 98 %, unknown if currently breastfeeding.  Physical Exam:  General: alert, cooperative and appears stated age Lochia: appropriate Uterine Fundus: firm Incision: NA DVT Evaluation: No evidence of DVT seen on physical exam. Negative Homan's sign.   Recent Labs  06/21/15 0910 06/22/15 0512  HGB 12.9 12.4  HCT 38.0 37.3    Assessment/Plan: Plan for discharge tomorrow and Breastfeeding Infant feeding soley Breast;  LOS: 1 day   Saadiq Poche Burr 06/22/2015, 7:55 AM

## 2015-06-23 NOTE — Progress Notes (Signed)
All discharge instructions given to patient and she voices understanding of all instructions given. She will make her own f/u appt. No prescriptions were given. Pt discharged home with spouse escorted out by auxillary in wheelchair with infant in her arms

## 2015-06-23 NOTE — Discharge Summary (Signed)
Obstetric Discharge Summary Reason for Admission: onset of labor Prenatal Procedures: ultrasound Intrapartum Procedures: spontaneous vaginal delivery Postpartum Procedures: none Complications-Operative and Postpartum: none HEMOGLOBIN  Date Value Ref Range Status  06/22/2015 12.4 12.0 - 16.0 g/dL Final  11/23/2014 12.6 g/dL Final   HGB  Date Value Ref Range Status  06/24/2014 13.0 12.0-16.0 g/dL Final   HCT  Date Value Ref Range Status  06/22/2015 37.3 35.0 - 47.0 % Final  11/23/2014 38 % Final  06/24/2014 39.1 35.0-47.0 % Final   HEMATOCRIT  Date Value Ref Range Status  03/31/2015 34.2 34.0 - 46.6 % Final    Physical Exam:  General: alert, cooperative and appears stated age Lochia: appropriate Uterine Fundus: firm Incision: NA DVT Evaluation: No evidence of DVT seen on physical exam. Negative Homan's sign.  Discharge Diagnoses: Term Pregnancy-delivered  Discharge Information: Date: 06/23/2015 Activity: pelvic rest Diet: routine Medications: PNV and Ibuprofen Condition: stable Instructions: refer to practice specific booklet Discharge to: home   Newborn Data: Live born female  Birth Weight: 7 lb 15 oz (3600 g) APGAR: 8, 9  Home with mother.  Melody Burr 06/23/2015, 8:31 AM

## 2015-06-23 NOTE — Discharge Instructions (Signed)
Follow up sooner with fever, problems breathing, pain not helped by medications, severe depression( more than just baby blues, wanting to hurt yourself or the baby), severe bleeding ( saturating more than one pad an hour or large palm sized clots), no heavy lifting , no driving while taking narcotics, no douches, intercourse, tampons or enemas for 6 weeks  Vaginal Delivery During delivery, your health care provider will help you give birth to your baby. During a vaginal delivery, you will work to push the baby out of your vagina. However, before you can push your baby out, a few things need to happen. The opening of your uterus (cervix) has to soften, thin out, and open up (dilate) all the way to 10 cm. Also, your baby has to move down from the uterus into your vagina.  SIGNS OF LABOR  Your health care provider will first need to make sure you are in labor. Signs of labor include:   Passing what is called the mucous plug before labor begins. This is a small amount of blood-stained mucus.  Having regular, painful uterine contractions.   The time between contractions gets shorter.   The discomfort and pain gradually get more intense.  Contraction pains get worse when walking and do not go away when resting.   Your cervix becomes thinner (effacement) and dilates. BEFORE THE DELIVERY Once you are in labor and admitted into the hospital or care center, your health care provider may do the following:   Perform a complete physical exam.  Review any complications related to pregnancy or labor.  Check your blood pressure, pulse, temperature, and heart rate (vital signs).   Determine if, and when, the rupture of amniotic membranes occurred.  Do a vaginal exam (using a sterile glove and lubricant) to determine:   The position (presentation) of the baby. Is the baby's head presenting first (vertex) in the birth canal (vagina), or are the feet or buttocks first (breech)?   The level  (station) of the baby's head within the birth canal.   The effacement and dilatation of the cervix.   An electronic fetal monitor is usually placed on your abdomen when you first arrive. This is used to monitor your contractions and the baby's heart rate.  When the monitor is on your abdomen (external fetal monitor), it can only pick up the frequency and length of your contractions. It cannot tell the strength of your contractions.  If it becomes necessary for your health care provider to know exactly how strong your contractions are or to see exactly what the baby's heart rate is doing, an internal monitor may be inserted into your vagina and uterus. Your health care provider will discuss the benefits and risks of using an internal monitor and obtain your permission before inserting the device.  Continuous fetal monitoring may be needed if you have an epidural, are receiving certain medicines (such as oxytocin), or have pregnancy or labor complications.  An IV access tube may be placed into a vein in your arm to deliver fluids and medicines if necessary. THREE STAGES OF LABOR AND DELIVERY Normal labor and delivery is divided into three stages. First Stage This stage starts when you begin to contract regularly and your cervix begins to efface and dilate. It ends when your cervix is completely open (fully dilated). The first stage is the longest stage of labor and can last from 3 hours to 15 hours.  Several methods are available to help with labor pain. You and your  health care provider will decide which option is best for you. Options include:   Opioid medicines. These are strong pain medicines that you can get through your IV tube or as a shot into your muscle. These medicines lessen pain but do not make it go away completely.  Epidural. A medicine is given through a thin tube that is inserted in your back. The medicine numbs the lower part of your body and prevents any pain in that  area.  Paracervical pain medicine. This is an injection of an anesthetic on each side of your cervix.   You may request natural childbirth, which does not involve the use of pain medicines or an epidural during labor and delivery. Instead, you will use other things, such as breathing exercises, to help cope with the pain. Second Stage The second stage of labor begins when your cervix is fully dilated at 10 cm. It continues until you push your baby down through the birth canal and the baby is born. This stage can take only minutes or several hours.  The location of your baby's head as it moves through the birth canal is reported as a number called a station. If the baby's head has not started its descent, the station is described as being at minus 3 (-3). When your baby's head is at the zero station, it is at the middle of the birth canal and is engaged in the pelvis. The station of your baby helps indicate the progress of the second stage of labor.  When your baby is born, your health care provider may hold the baby with his or her head lowered to prevent amniotic fluid, mucus, and blood from getting into the baby's lungs. The baby's mouth and nose may be suctioned with a small bulb syringe to remove any additional fluid.  Your health care provider may then place the baby on your stomach. It is important to keep the baby from getting cold. To do this, the health care provider will dry the baby off, place the baby directly on your skin (with no blankets between you and the baby), and cover the baby with warm, dry blankets.   The umbilical cord is cut. Third Stage During the third stage of labor, your health care provider will deliver the placenta (afterbirth) and make sure your bleeding is under control. The delivery of the placenta usually takes about 5 minutes but can take up to 30 minutes. After the placenta is delivered, a medicine may be given either by IV or injection to help contract the  uterus and control bleeding. If you are planning to breastfeed, you can try to do so now. After you deliver the placenta, your uterus should contract and get very firm. If your uterus does not remain firm, your health care provider will massage it. This is important because the contraction of the uterus helps cut off bleeding at the site where the placenta was attached to your uterus. If your uterus does not contract properly and stay firm, you may continue to bleed heavily. If there is a lot of bleeding, medicines may be given to contract the uterus and stop the bleeding.  Document Released: 07/18/2008 Document Revised: 02/23/2014 Document Reviewed: 03/30/2013 [facility] Patient Information 2015 [place], [place]. This information is not intended to replace advice given to you by your health care provider. Make sure you discuss any questions you have with your health care provider.

## 2015-06-24 ENCOUNTER — Inpatient Hospital Stay: Admit: 2015-06-24 | Payer: 59 | Admitting: Obstetrics and Gynecology

## 2015-08-04 ENCOUNTER — Ambulatory Visit: Payer: 59 | Admitting: Obstetrics and Gynecology

## 2015-09-07 ENCOUNTER — Ambulatory Visit (INDEPENDENT_AMBULATORY_CARE_PROVIDER_SITE_OTHER): Payer: 59 | Admitting: Obstetrics and Gynecology

## 2015-09-07 ENCOUNTER — Encounter: Payer: Self-pay | Admitting: Obstetrics and Gynecology

## 2015-09-07 NOTE — Progress Notes (Signed)
  Subjective:     Angela Bush is a 34 y.o. female who presents for a postpartum visit. She is 10 weeks postpartum following a spontaneous vaginal delivery. I have fully reviewed the prenatal and intrapartum course. The delivery was at 6 gestational weeks. Outcome: spontaneous vaginal delivery. Anesthesia: IV sedation. Postpartum course has been uncomplicated. Baby's course has been complicated by fractured clavical. Baby is feeding by formula. Bleeding no bleeding. Bowel function is normal. Bladder function is normal. Patient is sexually active. Contraception method is condoms. Postpartum depression screening: negative.  The following portions of the patient's history were reviewed and updated as appropriate: allergies, current medications, past family history, past medical history, past social history, past surgical history and problem list.  Review of Systems A comprehensive review of systems was negative.   Objective:    BP 100/62 mmHg  Pulse 88  Ht 5\' 3"  (1.6 m)  Wt 148 lb 1.6 oz (67.178 kg)  BMI 26.24 kg/m2  LMP 08/18/2015 (Exact Date)  Breastfeeding? No  General:  alert, cooperative and appears stated age   Breasts:  inspection negative, no nipple discharge or bleeding, no masses or nodularity palpable  Lungs: clear to auscultation bilaterally  Heart:  regular rate and rhythm, S1, S2 normal, no murmur, click, rub or gallop  Abdomen: soft, non-tender; bowel sounds normal; no masses,  no organomegaly   Vulva:  normal  Vagina: normal vagina  Cervix:  multiparous appearance  Corpus: normal size, contour, position, consistency, mobility, non-tender  Adnexa:  normal adnexa and no mass, fullness, tenderness  Rectal Exam: Not performed.        Assessment:     11 weeks postpartum exam. Pap smear not done at today's visit.   Plan:    1. Contraception: NuvaRing vaginal inserts 3. Follow up in: 3 months for AE or as needed.

## 2015-09-07 NOTE — Patient Instructions (Signed)
  Place postpartum visit patient instructions here.  

## 2015-12-08 ENCOUNTER — Ambulatory Visit (INDEPENDENT_AMBULATORY_CARE_PROVIDER_SITE_OTHER): Payer: 59 | Admitting: Obstetrics and Gynecology

## 2015-12-08 ENCOUNTER — Encounter: Payer: Self-pay | Admitting: Obstetrics and Gynecology

## 2015-12-08 VITALS — BP 111/76 | HR 75 | Ht 63.0 in | Wt 139.4 lb

## 2015-12-08 DIAGNOSIS — Z01419 Encounter for gynecological examination (general) (routine) without abnormal findings: Secondary | ICD-10-CM

## 2015-12-08 NOTE — Progress Notes (Signed)
Subjective:   Angela Bush is a 35 y.o. [phone] Caucasian female here for a routine well-woman exam.  Patient's last menstrual period was 11/30/2015.    Current complaints: none PCP: Ronnald Ramp       doesn't desire labs  Social History: Sexual: heterosexual Marital Status: married Living situation: with family Occupation: Therapist, sports at [employer] Tobacco/alcohol: no tobacco use Illicit drugs: no history of illicit drug use  The following portions of the patient's history were reviewed and updated as appropriate: allergies, current medications, past family history, past medical history, past social history, past surgical history and problem list.  Past Medical History Past Medical History  Diagnosis Date  . Hx of hypercholesterolemia   . SVT (supraventricular tachycardia) ([place])   . Abnormal fetal ultrasound     Past Surgical History Past Surgical History  Procedure Laterality Date  . Back surgery    . Tonsillectomy    . Dilation and curettage of uterus  9 2015    Gynecologic History [id number]  Patient's last menstrual period was 11/30/2015. Contraception: condoms Last Pap: 2015. Results were: normal   Obstetric History OB History  Gravida Para Term Preterm AB SAB TAB Ectopic Multiple Living  5 2 1 1 1 1    0 3    # Outcome Date GA Lbr Len/2nd Weight Sex Delivery Anes PTL Lv  5 Term 06/21/15 [redacted]w[redacted]d 06:43 / 00:10 7 lb 15 oz (3.6 kg) F Vag-Spont None  Y  4 Gravida 04/24/11    F Vag-Spont   Y  3 Gravida 08/24/07    M Vag-Spont   Y  2 Preterm 2007     Vag-Spont   FD  1 SAB               Current Medications Current Outpatient Prescriptions on File Prior to Visit  Medication Sig Dispense Refill  . etonogestrel-ethinyl estradiol (NUVARING) 0.12-0.015 MG/24HR vaginal ring Place 1 each vaginally every 28 (twenty-eight) days. Reported on 12/08/2015    . Prenatal Vit-Fe Fumarate-FA (PRENATAL MULTIVITAMIN) TABS tablet Take 1 tablet by mouth daily at 12 noon. Reported on 12/08/2015     No  current facility-administered medications on file prior to visit.    Review of Systems Patient denies any headaches, blurred vision, shortness of breath, chest pain, abdominal pain, problems with bowel movements, urination, or intercourse.  Objective:  BP 111/76 mmHg  Pulse 75  Ht 5\' 3"  (1.6 m)  Wt 139 lb 6.4 oz (63.231 kg)  BMI 24.70 kg/m2  LMP 11/30/2015  Breastfeeding? No Physical Exam  General:  Well developed, well nourished, no acute distress. She is alert and oriented x3. Skin:  Warm and dry Neck:  Midline trachea, no thyromegaly or nodules Cardiovascular: Regular rate and rhythm, no murmur heard Lungs:  Effort normal, all lung fields clear to auscultation bilaterally Breasts:  No dominant palpable mass, retraction, or nipple discharge Abdomen:  Soft, non tender, no hepatosplenomegaly or masses Pelvic:  External genitalia is normal in appearance.  The vagina is normal in appearance. The cervix is bulbous, no CMT.  Thin prep pap is not done. Uterus is felt to be normal size, shape, and contour.  No adnexal masses or tenderness noted. Extremities:  No swelling or varicosities noted Psych:  She has a normal mood and affect  Assessment:   Healthy well-woman exam  Plan:  No labs indicated F/U 1 year for AE, or sooner if needed   [place] Kestner Rockney Ghee, CNM

## 2015-12-08 NOTE — Patient Instructions (Signed)
Place annual gynecologic exam patient instructions here.

## 2016-01-10 ENCOUNTER — Telehealth: Payer: Self-pay | Admitting: Obstetrics and Gynecology

## 2016-01-10 NOTE — Telephone Encounter (Signed)
When pt was here Angela Bush told her about a dermatologist. She can't remember the name. Can you give pt a call and let her know or I can call her.

## 2016-01-11 NOTE — Telephone Encounter (Signed)
Gave pt all names of dermatology

## 2016-05-02 DIAGNOSIS — L578 Other skin changes due to chronic exposure to nonionizing radiation: Secondary | ICD-10-CM | POA: Diagnosis not present

## 2016-05-02 DIAGNOSIS — D225 Melanocytic nevi of trunk: Secondary | ICD-10-CM | POA: Diagnosis not present

## 2016-05-02 DIAGNOSIS — I781 Nevus, non-neoplastic: Secondary | ICD-10-CM | POA: Diagnosis not present

## 2016-05-02 DIAGNOSIS — D229 Melanocytic nevi, unspecified: Secondary | ICD-10-CM | POA: Diagnosis not present

## 2016-05-02 DIAGNOSIS — L700 Acne vulgaris: Secondary | ICD-10-CM | POA: Diagnosis not present

## 2016-05-02 DIAGNOSIS — D485 Neoplasm of uncertain behavior of skin: Secondary | ICD-10-CM | POA: Diagnosis not present

## 2016-05-02 DIAGNOSIS — Z1283 Encounter for screening for malignant neoplasm of skin: Secondary | ICD-10-CM | POA: Diagnosis not present

## 2016-06-05 ENCOUNTER — Telehealth: Payer: 59 | Admitting: Family Medicine

## 2016-06-05 ENCOUNTER — Other Ambulatory Visit: Payer: Self-pay | Admitting: Family Medicine

## 2016-06-05 DIAGNOSIS — J0120 Acute ethmoidal sinusitis, unspecified: Secondary | ICD-10-CM

## 2016-06-05 MED ORDER — AZITHROMYCIN 250 MG PO TABS: ORAL_TABLET | ORAL | 0 refills | Status: DC

## 2016-06-05 NOTE — Progress Notes (Signed)
We are sorry that you are not feeling well.  Here is how we plan to help!  Based on what you have shared with me it looks like you have sinusitis.  Sinusitis is inflammation and infection in the sinus cavities of the head.  Based on your presentation I believe you most likely have Acute Viral Sinusitis.This is an infection most likely caused by a virus. There is not specific treatment for viral sinusitis other than to help you with the symptoms until the infection runs its course.  You may use an oral decongestant such as Mucinex D or if you have glaucoma or high blood pressure use plain Mucinex. Saline nasal spray help and can safely be used as often as needed for congestion, I have prescribed: Z pack take as directed. Increase daily fluids.  Some authorities believe that zinc sprays or the use of Echinacea may shorten the course of your symptoms.  Sinus infections are not as easily transmitted as other respiratory infection, however we still recommend that you avoid close contact with loved ones, especially the very young and elderly.  Remember to wash your hands thoroughly throughout the day as this is the number one way to prevent the spread of infection!  Home Care:  Only take medications as instructed by your medical team.  Complete the entire course of an antibiotic.  Do not take these medications with alcohol.  A steam or ultrasonic humidifier can help congestion.  You can place a towel over your head and breathe in the steam from hot water coming from a faucet.  Avoid close contacts especially the very young and the elderly.  Cover your mouth when you cough or sneeze.  Always remember to wash your hands.  Get Help Right Away If:  You develop worsening fever or sinus pain.  You develop a severe head ache or visual changes.  Your symptoms persist after you have completed your treatment plan.  Make sure you  Understand these instructions.  Will watch your condition.  Will  get help right away if you are not doing well or get worse.  Your e-visit answers were reviewed by a board certified advanced clinical practitioner to complete your personal care plan.  Depending on the condition, your plan could have included both over the counter or prescription medications.  If there is a problem please reply  once you have received a response from your provider.  Your safety is important to [place].  If you have drug allergies check your prescription carefully.    You can use MyChart to ask questions about today's visit, request a non-urgent call back, or ask for a work or school excuse for 24 hours related to this e-Visit. If it has been greater than 24 hours you will need to follow up with your provider, or enter a new e-Visit to address those concerns.  You will get an e-mail in the next two days asking about your experience.  I hope that your e-visit has been valuable and will speed your recovery. Thank you for using e-visits.

## 2016-06-07 ENCOUNTER — Telehealth: Payer: 59 | Admitting: Family

## 2016-06-07 DIAGNOSIS — J329 Chronic sinusitis, unspecified: Secondary | ICD-10-CM | POA: Diagnosis not present

## 2016-06-07 MED ORDER — FLUTICASONE PROPIONATE 50 MCG/ACT NA SUSP: 2.0000 | Freq: Every day | NASAL | 6 refills | Status: DC

## 2016-06-07 NOTE — Progress Notes (Signed)
We are sorry that you are not feeling well.  Here is how we plan to help!  Based on what you have shared with me it looks like you have sinusitis.  Sinusitis is inflammation and infection in the sinus cavities of the head.  Based on your presentation I believe you most likely have Acute Viral Sinusitis.This is an infection most likely caused by a virus. There is not specific treatment for viral sinusitis other than to help you with the symptoms until the infection runs its course.  You may use an oral decongestant such as Mucinex D or if you have glaucoma or high blood pressure use plain Mucinex. Saline nasal spray help and can safely be used as often as needed for congestion, I have prescribed: Fluticasone nasal spray two sprays in each nostril twice a day  You need to be on antibiotic for a full 72 hours before we can tell that it is not working.    Some authorities believe that zinc sprays or the use of Echinacea may shorten the course of your symptoms.  Sinus infections are not as easily transmitted as other respiratory infection, however we still recommend that you avoid close contact with loved ones, especially the very young and elderly.  Remember to wash your hands thoroughly throughout the day as this is the number one way to prevent the spread of infection!  Home Care:  Only take medications as instructed by your medical team.  Complete the entire course of an antibiotic.  Do not take these medications with alcohol.  A steam or ultrasonic humidifier can help congestion.  You can place a towel over your head and breathe in the steam from hot water coming from a faucet.  Avoid close contacts especially the very young and the elderly.  Cover your mouth when you cough or sneeze.  Always remember to wash your hands.  Get Help Right Away If:  You develop worsening fever or sinus pain.  You develop a severe head ache or visual changes.  Your symptoms persist after you have  completed your treatment plan.  Make sure you  Understand these instructions.  Will watch your condition.  Will get help right away if you are not doing well or get worse.  Your e-visit answers were reviewed by a board certified advanced clinical practitioner to complete your personal care plan.  Depending on the condition, your plan could have included both over the counter or prescription medications.  If there is a problem please reply  once you have received a response from your provider.  Your safety is important to [place].  If you have drug allergies check your prescription carefully.    You can use MyChart to ask questions about today's visit, request a non-urgent call back, or ask for a work or school excuse for 24 hours related to this e-Visit. If it has been greater than 24 hours you will need to follow up with your provider, or enter a new e-Visit to address those concerns.  You will get an e-mail in the next two days asking about your experience.  I hope that your e-visit has been valuable and will speed your recovery. Thank you for using e-visits.

## 2016-06-08 ENCOUNTER — Encounter: Payer: Self-pay | Admitting: Physician Assistant

## 2016-06-08 ENCOUNTER — Ambulatory Visit: Payer: Self-pay | Admitting: Physician Assistant

## 2016-06-08 VITALS — BP 100/70 | HR 80 | Temp 99.0°F

## 2016-06-08 DIAGNOSIS — J209 Acute bronchitis, unspecified: Secondary | ICD-10-CM

## 2016-06-08 MED ORDER — LEVOFLOXACIN 500 MG PO TABS: 500.0000 mg | ORAL_TABLET | Freq: Every day | ORAL | 0 refills | Status: DC

## 2016-06-08 MED ORDER — HYDROCOD POLST-CPM POLST ER 10-8 MG/5ML PO SUER: 5.0000 mL | Freq: Two times a day (BID) | ORAL | 0 refills | Status: DC | PRN

## 2016-06-08 MED ORDER — ALBUTEROL SULFATE HFA 108 (90 BASE) MCG/ACT IN AERS: 2.0000 | INHALATION_SPRAY | Freq: Four times a day (QID) | RESPIRATORY_TRACT | 0 refills | Status: DC | PRN

## 2016-06-08 NOTE — Progress Notes (Addendum)
S: C/o cough and congestion with ear pain for 4 days, + fever of 102 this am, , chest is sore from coughing, + fever, chills, , cough is dry and hacking; keeping pt awake at night;  denies cardiac type chest pain or sob, v/d, abd pain, called e-visit and was given zpack, is not helping, feels that she is worsening; Remainder ros neg  O: vitals w elevated temp, others wnl, nad but pt appears ill;  tms clear, throat injected, neck supple no lymph, lungs with rhonchi left lower lung, cv rrr, neuro intact, cough is dry/hacking  A:  Acute bronchitis   P:  rx medication:  levaquin 500mg  qd, albuterol inhaler, tussionex 138ml nr, stop zpack; use otc meds, tylenol or motrin as needed for fever/chills, return if not better in 3 -5 days, return earlier if worsening Pt called office, is better but still having trouble breathing, ?if could get a steroid, sent to [place]

## 2016-06-13 MED ORDER — METHYLPREDNISOLONE 4 MG PO TBPK: ORAL_TABLET | ORAL | 0 refills | Status: DC

## 2016-06-13 NOTE — Addendum Note (Signed)
Addended by: Versie Starks on: 06/13/2016 11:31 AM   Modules accepted: Orders

## 2016-08-14 ENCOUNTER — Ambulatory Visit
Admission: RE | Admit: 2016-08-14 | Discharge: 2016-08-14 | Disposition: A | Discharge status: Home / Self Care | Payer: 59 | Source: Ambulatory Visit | Attending: Physician Assistant | Admitting: Physician Assistant

## 2016-08-14 ENCOUNTER — Ambulatory Visit: Payer: Self-pay | Admitting: Physician Assistant

## 2016-08-14 ENCOUNTER — Encounter: Payer: Self-pay | Admitting: Physician Assistant

## 2016-08-14 VITALS — BP 94/76 | HR 80 | Temp 98.5°F

## 2016-08-14 DIAGNOSIS — R918 Other nonspecific abnormal finding of lung field: Secondary | ICD-10-CM | POA: Insufficient documentation

## 2016-08-14 DIAGNOSIS — R05 Cough: Secondary | ICD-10-CM | POA: Insufficient documentation

## 2016-08-14 DIAGNOSIS — R059 Cough, unspecified: Secondary | ICD-10-CM

## 2016-08-14 MED ORDER — METHYLPREDNISOLONE 4 MG PO TBPK: ORAL_TABLET | ORAL | 0 refills | Status: DC

## 2016-08-14 MED ORDER — AZITHROMYCIN 250 MG PO TABS: ORAL_TABLET | ORAL | 0 refills | Status: DC

## 2016-08-14 MED ORDER — FLUTICASONE-SALMETEROL 115-21 MCG/ACT IN AERO: 2.0000 | INHALATION_SPRAY | Freq: Two times a day (BID) | RESPIRATORY_TRACT | 12 refills | Status: DC

## 2016-08-14 MED ORDER — IPRATROPIUM-ALBUTEROL 0.5-2.5 (3) MG/3ML IN SOLN: 3.0000 mL | Freq: Once | RESPIRATORY_TRACT | Status: AC

## 2016-08-14 NOTE — Progress Notes (Addendum)
S: C/o cough and congestion with wheezing and chest pain, chest is sore from coughing, no fever, +chills,  cough is dry and hacking; keeping pt awake at night;  denies cardiac type chest pain or sob, v/d, abd pain Remainder ros neg  O: vitals wnl, nad, tms clear, throat injected, neck supple no lymph, lungs with wheezing, cv rrr, neuro intact, svn duoneb given, air movement increased still wheezing  A:  Acute bronchitis   P:  rx medication:   Zpack, medrol dose pack, advair inhaler, cxr today; use otc meds, tylenol or motrin as needed for fever/chills, return if not better in 3 -5 days, return earlier if worsening  Pt called and is not feeling better, cxr + interstitial pneumonia, called in levaquin, pt to stop zpack

## 2016-08-18 MED ORDER — LEVOFLOXACIN 500 MG PO TABS: 500.0000 mg | ORAL_TABLET | Freq: Every day | ORAL | 0 refills | Status: DC

## 2016-08-18 NOTE — Addendum Note (Signed)
Addended by: Versie Starks on: 08/18/2016 09:13 AM   Modules accepted: Orders

## 2016-11-15 ENCOUNTER — Ambulatory Visit: Payer: Self-pay | Admitting: Physician Assistant

## 2016-11-15 ENCOUNTER — Ambulatory Visit
Admission: RE | Admit: 2016-11-15 | Discharge: 2016-11-15 | Disposition: A | Discharge status: Home / Self Care | Payer: 59 | Source: Ambulatory Visit | Attending: Physician Assistant | Admitting: Physician Assistant

## 2016-11-15 ENCOUNTER — Encounter: Payer: Self-pay | Admitting: Physician Assistant

## 2016-11-15 VITALS — BP 100/70 | HR 83 | Temp 98.5°F

## 2016-11-15 DIAGNOSIS — R059 Cough, unspecified: Secondary | ICD-10-CM

## 2016-11-15 DIAGNOSIS — R05 Cough: Secondary | ICD-10-CM

## 2016-11-15 DIAGNOSIS — J209 Acute bronchitis, unspecified: Secondary | ICD-10-CM

## 2016-11-15 MED ORDER — METHYLPREDNISOLONE 4 MG PO TBPK: ORAL_TABLET | ORAL | 0 refills | Status: DC

## 2016-11-15 MED ORDER — LEVOFLOXACIN 500 MG PO TABS: 500.0000 mg | ORAL_TABLET | Freq: Every day | ORAL | 0 refills | Status: DC

## 2016-11-15 NOTE — Progress Notes (Signed)
S: C/o cough and congestion with wheezing and chest pain, chest is sore from coughing, denies fever, chills; states cough is dry and hacking; keeping pt awake at night;  denies cardiac type chest pain or sob, v/d, abd pain, did not get repeat cxr after having pneumonia in october Remainder ros neg  O: vitals wnl, nad, tms clear, throat injected, neck supple no lymph, lungs c t a, cv rrr, neuro intact, cough is dry  A:  Acute bronchitis   P:  rx medication: zpack, prednisone 30mg  qd x 3d;  use otc meds, tylenol or motrin as needed for fever/chills, return if not better in 3 -5 days, return earlier if worsening

## 2016-11-16 NOTE — Progress Notes (Signed)
Spoke with patient this morning informed her of cxr and recommendation. Patient acknowledge understanding

## 2016-11-25 ENCOUNTER — Telehealth: Payer: 59 | Admitting: Nurse Practitioner

## 2016-11-25 DIAGNOSIS — J111 Influenza due to unidentified influenza virus with other respiratory manifestations: Secondary | ICD-10-CM | POA: Diagnosis not present

## 2016-11-25 MED ORDER — OSELTAMIVIR PHOSPHATE 75 MG PO CAPS: 75.0000 mg | ORAL_CAPSULE | Freq: Two times a day (BID) | ORAL | 0 refills | Status: DC

## 2016-11-25 NOTE — Progress Notes (Signed)

## 2016-12-11 ENCOUNTER — Telehealth: Payer: Self-pay | Admitting: Physician Assistant

## 2016-12-11 ENCOUNTER — Ambulatory Visit
Admission: RE | Admit: 2016-12-11 | Discharge: 2016-12-11 | Disposition: A | Discharge status: Home / Self Care | Payer: 59 | Source: Ambulatory Visit | Attending: Physician Assistant | Admitting: Physician Assistant

## 2016-12-11 ENCOUNTER — Encounter: Payer: Self-pay | Admitting: Physician Assistant

## 2016-12-11 DIAGNOSIS — J189 Pneumonia, unspecified organism: Secondary | ICD-10-CM

## 2016-12-11 DIAGNOSIS — R918 Other nonspecific abnormal finding of lung field: Secondary | ICD-10-CM | POA: Insufficient documentation

## 2016-12-11 NOTE — Telephone Encounter (Signed)
Sent order for cxr to [facility] road/outpatient imaging

## 2016-12-11 NOTE — Telephone Encounter (Signed)
Spoke with patient to inform her that order was sent for CXR

## 2016-12-11 NOTE — Telephone Encounter (Signed)
Order for repeat cxr, pt had pneumonia (interstitial) 11-15-16

## 2016-12-12 ENCOUNTER — Telehealth: Payer: Self-pay | Admitting: Physician Assistant

## 2016-12-14 ENCOUNTER — Ambulatory Visit (INDEPENDENT_AMBULATORY_CARE_PROVIDER_SITE_OTHER): Payer: 59 | Admitting: Family Medicine

## 2016-12-14 ENCOUNTER — Telehealth: Payer: Self-pay | Admitting: Internal Medicine

## 2016-12-14 ENCOUNTER — Encounter: Payer: Self-pay | Admitting: Family Medicine

## 2016-12-14 VITALS — BP 120/60 | HR 80 | Ht 63.0 in | Wt 135.0 lb

## 2016-12-14 DIAGNOSIS — J189 Pneumonia, unspecified organism: Secondary | ICD-10-CM

## 2016-12-14 DIAGNOSIS — R938 Abnormal findings on diagnostic imaging of other specified body structures: Secondary | ICD-10-CM | POA: Diagnosis not present

## 2016-12-14 DIAGNOSIS — R9389 Abnormal findings on diagnostic imaging of other specified body structures: Secondary | ICD-10-CM

## 2016-12-14 NOTE — Addendum Note (Signed)
Addended by: Fredderick Severance on: 12/14/2016 04:20 PM   Modules accepted: Orders

## 2016-12-14 NOTE — Telephone Encounter (Signed)
Patient has new patient referral from pcp for recurrent pneumonia and abnormal cxr.  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  Patient added to the wait list .  Is there anywhere that kasa may be able to see her sooner than next available with ram 03-27?

## 2016-12-14 NOTE — Progress Notes (Signed)
Name: Angela Bush   MRN: [id number]    DOB: Apr 15, 1981   Date:12/14/2016       Progress Note  Subjective  Chief Complaint  Chief Complaint  Patient presents with  . Establish Care  . Follow-up    pneumonia- "have a spot on lung"    Patient has had recurent community acqurire pneumonia bilateral/right side.  Time course September 2017/ three treatable episodes.  Sx : cough/productive, fever, dyspnea, and chest discomfort.   Pneumonia  She complains of chest tightness, cough, difficulty breathing, shortness of breath, sputum production and wheezing. There is no frequent throat clearing, hemoptysis or hoarse voice. Primary symptoms comments: History thereof. This is a recurrent problem. The current episode started more than 1 month ago. The problem occurs intermittently. The problem has been waxing and waning. The cough is productive of sputum and productive of purulent sputum. Associated symptoms include chest pain, a fever, malaise/fatigue and myalgias. Pertinent negatives include no ear pain, headaches, heartburn, nasal congestion, orthopnea, PND, postnasal drip, rhinorrhea, sneezing, sore throat, sweats, trouble swallowing or weight loss. Her symptoms are alleviated by beta-agonist, steroid inhaler and oral steroids (levoquin). She reports moderate (recurrence) improvement on treatment. There are no known risk factors for lung disease. Her past medical history is significant for pneumonia.    No problem-specific Assessment & Plan notes found for this encounter.   Past Medical History:  Diagnosis Date  . Abnormal fetal ultrasound   . Hx of hypercholesterolemia   . SVT (supraventricular tachycardia) ([facility])     Past Surgical History:  Procedure Laterality Date  . BACK SURGERY    . DILATION AND CURETTAGE OF UTERUS  9 2015  . TONSILLECTOMY      Family History  Problem Relation Age of Onset  . Hypertension Father   . Hyperlipidemia Father     Social History   Social  History  . Marital status: Married    Spouse name: N/A  . Number of children: N/A  . Years of education: N/A   Occupational History  . Not on file.   Social History Main Topics  . Smoking status: Never Smoker  . Smokeless tobacco: Never Used  . Alcohol use No  . Drug use: No  . Sexual activity: Yes    Birth control/ protection: Condom   Other Topics Concern  . Not on file   Social History Narrative  . No narrative on file    Allergies  Allergen Reactions  . Latex     Outpatient Medications Prior to Visit  Medication Sig Dispense Refill  . albuterol (PROVENTIL HFA;VENTOLIN HFA) 108 (90 Base) MCG/ACT inhaler Inhale 2 puffs into the lungs every 6 (six) hours as needed for wheezing or shortness of breath. (Patient not taking: Reported on 12/14/2016) 1 Inhaler 0  . fluticasone-salmeterol (ADVAIR HFA) 115-21 MCG/ACT inhaler Inhale 2 puffs into the lungs 2 (two) times daily. (Patient not taking: Reported on 12/14/2016) 1 Inhaler 12  . chlorpheniramine-HYDROcodone (TUSSIONEX PENNKINETIC ER) 10-8 MG/5ML SUER Take 5 mLs by mouth every 12 (twelve) hours as needed for cough. (Patient not taking: Reported on 11/15/2016) 150 mL 0  . fluticasone (FLONASE) 50 MCG/ACT nasal spray Place 2 sprays into both nostrils daily. 16 g 6  . levofloxacin (LEVAQUIN) 500 MG tablet Take 1 tablet (500 mg total) by mouth daily. (Patient not taking: Reported on 11/15/2016) 10 tablet 0  . levofloxacin (LEVAQUIN) 500 MG tablet Take 1 tablet (500 mg total) by mouth daily. 10 tablet 0  .  methylPREDNISolone (MEDROL DOSEPAK) 4 MG TBPK tablet Take 6 pills on day one then decrease by 1 pill each day 21 tablet 0  . oseltamivir (TAMIFLU) 75 MG capsule Take 1 capsule (75 mg total) by mouth 2 (two) times daily. 10 capsule 0   Facility-Administered Medications Prior to Visit  Medication Dose Route Frequency Provider Last Rate Last Dose  . ipratropium-albuterol (DUONEB) 0.5-2.5 (3) MG/3ML nebulizer solution 3 mL  3 mL  Nebulization Once Versie Starks, PA-C        Review of Systems  Constitutional: Positive for fever and malaise/fatigue. Negative for chills and weight loss.  HENT: Negative for ear discharge, ear pain, hoarse voice, postnasal drip, rhinorrhea, sneezing, sore throat and trouble swallowing.   Eyes: Negative for blurred vision.  Respiratory: Positive for cough, sputum production, shortness of breath and wheezing. Negative for hemoptysis.   Cardiovascular: Positive for chest pain. Negative for palpitations, leg swelling and PND.  Gastrointestinal: Negative for abdominal pain, blood in stool, constipation, diarrhea, heartburn, melena and nausea.  Genitourinary: Negative for dysuria, frequency, hematuria and urgency.  Musculoskeletal: Positive for myalgias. Negative for back pain, joint pain and neck pain.  Skin: Negative for rash.  Neurological: Negative for dizziness, tingling, sensory change, focal weakness and headaches.  Endo/Heme/Allergies: Negative for environmental allergies and polydipsia. Does not bruise/bleed easily.  Psychiatric/Behavioral: Negative for depression and suicidal ideas. The patient is not nervous/anxious and does not have insomnia.      Objective  Vitals:   12/14/16 0833  BP: 120/60  Pulse: 80  Weight: 135 lb (61.2 kg)  Height: 5\' 3"  (1.6 m)    Physical Exam  Constitutional: She is well-developed, well-nourished, and in no distress. No distress.  HENT:  Head: Normocephalic and atraumatic.  Right Ear: External ear normal.  Left Ear: External ear normal.  Nose: Nose normal.  Mouth/Throat: Oropharynx is clear and moist.  Eyes: Conjunctivae and EOM are normal. Pupils are equal, round, and reactive to light. Right eye exhibits no discharge. Left eye exhibits no discharge.  Neck: Normal range of motion. Neck supple. No JVD present. No thyromegaly present.  Cardiovascular: Normal rate, regular rhythm, normal heart sounds and intact distal pulses.  Exam reveals no  gallop and no friction rub.   No murmur heard. Pulmonary/Chest: Effort normal and breath sounds normal.  Abdominal: Soft. Bowel sounds are normal. She exhibits no mass. There is no tenderness. There is no guarding.  Musculoskeletal: Normal range of motion. She exhibits no edema.  Lymphadenopathy:    She has no cervical adenopathy.  Neurological: She is alert. She has normal reflexes.  Skin: Skin is warm and dry. She is not diaphoretic.  Psychiatric: Mood and affect normal.      Assessment & Plan  Problem List Items Addressed This Visit    None    Visit Diagnoses    Recurrent pneumonia    -  Primary   ? interstial    Relevant Orders   Ambulatory referral to Pulmonology   Abnormal chest x-ray       Relevant Orders   Ambulatory referral to Pulmonology      No orders of the defined types were placed in this encounter.     Dr. [facility]  12/14/16

## 2016-12-14 NOTE — Telephone Encounter (Signed)
Unfortunately not. That is his next available.

## 2016-12-14 NOTE — Telephone Encounter (Signed)
See previous note discussed with patient.

## 2016-12-20 ENCOUNTER — Ambulatory Visit
Admission: RE | Admit: 2016-12-20 | Discharge: 2016-12-20 | Disposition: A | Discharge status: Home / Self Care | Payer: 59 | Source: Ambulatory Visit | Attending: Family Medicine | Admitting: Family Medicine

## 2016-12-20 DIAGNOSIS — R938 Abnormal findings on diagnostic imaging of other specified body structures: Secondary | ICD-10-CM | POA: Diagnosis not present

## 2016-12-20 DIAGNOSIS — R918 Other nonspecific abnormal finding of lung field: Secondary | ICD-10-CM | POA: Insufficient documentation

## 2016-12-20 DIAGNOSIS — J189 Pneumonia, unspecified organism: Secondary | ICD-10-CM | POA: Diagnosis not present

## 2016-12-20 DIAGNOSIS — R9389 Abnormal findings on diagnostic imaging of other specified body structures: Secondary | ICD-10-CM

## 2016-12-20 MED ORDER — IOPAMIDOL (ISOVUE-300) INJECTION 61%
75.0000 mL | Freq: Once | INTRAVENOUS | Status: AC | PRN
  Administered 2016-12-20: 75 mL via INTRAVENOUS

## 2016-12-25 ENCOUNTER — Encounter: Payer: Self-pay | Admitting: Internal Medicine

## 2016-12-25 ENCOUNTER — Ambulatory Visit (INDEPENDENT_AMBULATORY_CARE_PROVIDER_SITE_OTHER): Payer: 59 | Admitting: Internal Medicine

## 2016-12-25 ENCOUNTER — Other Ambulatory Visit
Admission: RE | Admit: 2016-12-25 | Discharge: 2016-12-25 | Disposition: A | Discharge status: Home / Self Care | Payer: 59 | Source: Ambulatory Visit | Attending: Internal Medicine | Admitting: Internal Medicine

## 2016-12-25 VITALS — BP 118/70 | HR 86 | Wt 136.0 lb

## 2016-12-25 DIAGNOSIS — R0600 Dyspnea, unspecified: Secondary | ICD-10-CM

## 2016-12-25 DIAGNOSIS — R9389 Abnormal findings on diagnostic imaging of other specified body structures: Secondary | ICD-10-CM

## 2016-12-25 DIAGNOSIS — R938 Abnormal findings on diagnostic imaging of other specified body structures: Secondary | ICD-10-CM | POA: Diagnosis not present

## 2016-12-25 LAB — CBC WITH DIFFERENTIAL/PLATELET
Basophils Absolute: 0 10*3/uL (ref 0–0.1)
Basophils Relative: 1 %
Eosinophils Absolute: 0.2 10*3/uL (ref 0–0.7)
Eosinophils Relative: 3 %
HEMATOCRIT: 41.1 % (ref 35.0–47.0)
HEMOGLOBIN: 14.1 g/dL (ref 12.0–16.0)
LYMPHS ABS: 1.4 10*3/uL (ref 1.0–3.6)
LYMPHS PCT: 21 %
MCH: 32.2 pg (ref 26.0–34.0)
MCHC: 34.4 g/dL (ref 32.0–36.0)
MCV: 93.8 fL (ref 80.0–100.0)
MONO ABS: 0.5 10*3/uL (ref 0.2–0.9)
MONOS PCT: 7 %
NEUTROS ABS: 4.7 10*3/uL (ref 1.4–6.5)
NEUTROS PCT: 68 %
Platelets: 224 10*3/uL (ref 150–440)
RBC: 4.38 MIL/uL (ref 3.80–5.20)
RDW: 12.9 % (ref 11.5–14.5)
WBC: 6.9 10*3/uL (ref 3.6–11.0)

## 2016-12-25 LAB — COMPREHENSIVE METABOLIC PANEL
ALBUMIN: 4.6 g/dL (ref 3.5–5.0)
ALT: 15 U/L (ref 14–54)
AST: 23 U/L (ref 15–41)
Alkaline Phosphatase: 80 U/L (ref 38–126)
Anion gap: 6 (ref 5–15)
BUN: 7 mg/dL (ref 6–20)
CHLORIDE: 102 mmol/L (ref 101–111)
CO2: 30 mmol/L (ref 22–32)
CREATININE: 0.59 mg/dL (ref 0.44–1.00)
Calcium: 9.5 mg/dL (ref 8.9–10.3)
GFR calc Af Amer: >60 mL/min (ref >60)
Glucose, Bld: 72 mg/dL (ref 65–99)
POTASSIUM: 3.6 mmol/L (ref 3.5–5.1)
SODIUM: 138 mmol/L (ref 135–145)
Total Bilirubin: 0.6 mg/dL (ref 0.3–1.2)
Total Protein: 7.7 g/dL (ref 6.5–8.1)

## 2016-12-25 LAB — PREGNANCY, URINE: PREG TEST UR: NEGATIVE

## 2016-12-25 NOTE — Progress Notes (Signed)
[facility] Consultation      Date: 12/25/2016,   MRN# [id number] Angela Bush 12/31/1980 Code Status:  Code Status History    Date Active Date Inactive Code Status Order ID Comments User Context   06/21/2015  6:48 PM 06/23/2015  1:54 PM Full Code [id number]  Melody N Burr, CNM Inpatient   06/21/2015  1:13 PM 06/21/2015  6:48 PM Full Code [id number]  Jana M Grindheim, RN Inpatient   06/21/2015  9:07 AM 06/21/2015  1:13 PM Full Code [id number]  Jana M Grindheim, RN Inpatient     Hosp day:@LENGTHOFSTAYDAYS@ Referring MD: @ATDPROV@     PCP:      AdmissionWeight: 136 lb (61.7 kg)                 CurrentWeight: 136 lb (61.7 kg) Angela M Kawashima is a 36 y.o. old female seen in consultation for abnormal CT chest at the request of Dr. Jones.     CHIEF COMPLAINT:   I had pneumonia   HISTORY OF PRESENT ILLNESS   36 yo pleasant white female seen today for several reasons  1.patient has had recurrent bouts of URI and chest congestion  First episode 06/2016-given z pak then levaquin and prednisone-got better within days Second episode 08/2016 dx with pneumonia given levaquin and steroids got better within days 3rd episode Jan 2018-CXr shows abnormal opacity RT lung given levaquin and steroids-got better within days  2.Patient with RUL opacity/nodular appearance  RLL bronchiectasis with probable previous inflammation/infection  CT chest reviewed with patient: there are also liver and spleen findings with and adenopathy, also RUL nodular opacity  Currently, patient has no acute resp issues at this time. No signs of infection at this time No lower ext swelling no skin changes  2.patient   PAST MEDICAL HISTORY   Past Medical History:  Diagnosis Date  . Abnormal fetal ultrasound   . Hx of hypercholesterolemia   . SVT (supraventricular tachycardia) (HCC)      SURGICAL HISTORY   Past Surgical History:  Procedure Laterality Date  . BACK SURGERY    .  DILATION AND CURETTAGE OF UTERUS  9 2015  . TONSILLECTOMY       FAMILY HISTORY   Family History  Problem Relation Age of Onset  . Hypertension Father   . Hyperlipidemia Father      SOCIAL HISTORY   Social History  Substance Use Topics  . Smoking status: Never Smoker  . Smokeless tobacco: Never Used  . Alcohol use No     MEDICATIONS    Home Medication:  Current Outpatient Rx  . Order #: [id number]: Normal  . Order #: [id number]: Normal    Current Medication:  Current Outpatient Prescriptions:  .  albuterol (PROVENTIL HFA;VENTOLIN HFA) 108 (90 Base) MCG/ACT inhaler, Inhale 2 puffs into the lungs every 6 (six) hours as needed for wheezing or shortness of breath. (Patient not taking: Reported on 12/14/2016), Disp: 1 Inhaler, Rfl: 0 .  fluticasone-salmeterol (ADVAIR HFA) 115-21 MCG/ACT inhaler, Inhale 2 puffs into the lungs 2 (two) times daily. (Patient not taking: Reported on 12/14/2016), Disp: 1 Inhaler, Rfl: 12  Current Facility-Administered Medications:  .  ipratropium-albuterol (DUONEB) 0.5-2.5 (3) MG/3ML nebulizer solution 3 mL, 3 mL, Nebulization, Once, Susan W Fisher, PA-C    ALLERGIES   Latex     REVIEW OF SYSTEMS   Review of Systems  Constitutional: Negative for chills, diaphoresis, fever, malaise/fatigue and weight loss.  HENT: Negative for congestion and hearing loss.     Eyes: Negative for blurred vision and double vision.  Respiratory: Negative for cough, hemoptysis, sputum production, shortness of breath and wheezing.   Cardiovascular: Negative for chest pain, palpitations and orthopnea.  Gastrointestinal: Negative for abdominal pain, blood in stool, constipation, diarrhea, heartburn, nausea and vomiting.  Genitourinary: Negative for dysuria and urgency.  Musculoskeletal: Negative for back pain, myalgias and neck pain.  Skin: Negative for rash.  Neurological: Negative for dizziness, tingling, tremors, weakness and headaches.    Endo/Heme/Allergies: Does not bruise/bleed easily.  Psychiatric/Behavioral: Negative for depression, substance abuse and suicidal ideas.  All other systems reviewed and are negative.    VS: BP 118/70 (BP Location: Right Arm, Cuff Size: Normal)   Pulse 86   Wt 136 lb (61.7 kg)   LMP 11/27/2016   SpO2 99%   BMI 24.09 kg/m      PHYSICAL EXAM  Physical Exam  Constitutional: She is oriented to person, place, and time. She appears well-developed and well-nourished. No distress.  HENT:  Head: Normocephalic and atraumatic.  Mouth/Throat: No oropharyngeal exudate.  Eyes: EOM are normal. Pupils are equal, round, and reactive to light. No scleral icterus.  Neck: Normal range of motion. Neck supple.  Cardiovascular: Normal rate, regular rhythm and normal heart sounds.   No murmur heard. Pulmonary/Chest: No stridor. No respiratory distress. She has no wheezes.  Abdominal: Soft. Bowel sounds are normal.  Musculoskeletal: Normal range of motion. She exhibits no edema.  Neurological: She is alert and oriented to person, place, and time. No cranial nerve deficit.  Skin: Skin is warm. She is not diaphoretic.  Psychiatric: She has a normal mood and affect.           IMAGING    Dg Chest 2 View  Result Date: 12/11/2016 CLINICAL DATA:  Follow-up pneumonia. EXAM: CHEST  2 VIEW COMPARISON:  Multiple chest x-rays since October 2013 FINDINGS: Pulmonary opacity seen in the right mid lung, new since 2013. It is less conspicuous when compared October 2017 but more prominent when compared January 2018. No other interval changes or acute abnormalities. IMPRESSION: Right mid lung opacity as above with a waxing and waning course. Recommend a short-term follow-up in 6 weeks. If it does not resolve on that study, recommend CT imaging. Electronically Signed   By: David  Williams III M.D   On: 12/11/2016 18:42   Ct Chest W Contrast  Result Date: 12/20/2016 CLINICAL DATA:  Recurrent pneumonia.  Abnormal  chest radiograph. EXAM: CT CHEST WITH CONTRAST TECHNIQUE: Multidetector CT imaging of the chest was performed during intravenous contrast administration. CONTRAST:  75mL ISOVUE-300 IOPAMIDOL (ISOVUE-300) INJECTION 61% COMPARISON:  Chest radiograph 12/11/2016. FINDINGS: Cardiovascular: Vascular structures are unremarkable. Heart size normal. No pericardial effusion. Mediastinum/Nodes: No pathologically enlarged mediastinal, hilar or axillary lymph nodes. Esophagus is grossly unremarkable. Lungs/Pleura: Upper and midlung zone predominant peribronchovascular nodularity and mild architectural distortion. Fissural nodularity is noted as well. No pleural fluid. Airway is unremarkable. Upper Abdomen: There are multiple low-attenuation lesions in the liver which measure up to approximately 10 mm in the right hepatic lobe. Visualized portions of the adrenal glands and kidneys are unremarkable. There are multiple low-attenuation lesions in the spleen, measuring up to 1.5 cm. Visualized portions of the pancreas and stomach are grossly unremarkable. Small lymph nodes are seen in the upper abdomen. Musculoskeletal: Scoliosis. 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 No worrisome lytic or sclerotic lesions. IMPRESSION: Upper/ mid lung zone predominant perilymphatic nodularity together with multiple low-attenuation lesions in the liver and spleen, most   consistent with sarcoid. Electronically Signed   By: Melinda  Blietz M.D.   On: 12/20/2016 13:21    I have Independently reviewed images of 12/20/16 CT can 12/25/2016 Interpretation:RUL opacity, RLL bronchiectasis   ASSESSMENT/PLAN   36 yo white female with recurrent bouts of chest congestion/acute Bronchitis with abnormal CT scan with RUL opacity There is a possibility that this could be inflammatory process ie. Sarcoidosis At this time, patient still in process of diagnostic evaluation and work up  At this time, it would be beneficial for patient to obtain PET  scan for further assessment  1.will obtain PET scan 2.check CBC, CMP 3.check ACE levels and calcium levels 4.check Pregnancy test   Follow up after tests completed     Patient/Family are satisfied with Plan of action and management. All questions answered  Angela Bush, M.D.  [facility]  Medical Director [facility] Medical Director [facility]          

## 2016-12-25 NOTE — Patient Instructions (Addendum)
Check the following PET SCAN CBC CMP ACE level Calcium Levels Pregnancy test

## 2016-12-26 ENCOUNTER — Other Ambulatory Visit: Payer: Self-pay | Admitting: *Deleted

## 2016-12-26 LAB — ANGIOTENSIN CONVERTING ENZYME: ANGIOTENSIN-CONVERTING ENZYME: 55 U/L (ref 14–82)

## 2016-12-28 ENCOUNTER — Ambulatory Visit
Admission: RE | Admit: 2016-12-28 | Discharge: 2016-12-28 | Disposition: A | Discharge status: Home / Self Care | Payer: 59 | Source: Ambulatory Visit | Attending: Internal Medicine | Admitting: Internal Medicine

## 2016-12-28 ENCOUNTER — Telehealth: Payer: Self-pay | Admitting: *Deleted

## 2016-12-28 DIAGNOSIS — R911 Solitary pulmonary nodule: Secondary | ICD-10-CM | POA: Diagnosis not present

## 2016-12-28 DIAGNOSIS — R160 Hepatomegaly, not elsewhere classified: Secondary | ICD-10-CM

## 2016-12-28 DIAGNOSIS — R0600 Dyspnea, unspecified: Secondary | ICD-10-CM | POA: Insufficient documentation

## 2016-12-28 DIAGNOSIS — R938 Abnormal findings on diagnostic imaging of other specified body structures: Secondary | ICD-10-CM | POA: Diagnosis not present

## 2016-12-28 DIAGNOSIS — R9389 Abnormal findings on diagnostic imaging of other specified body structures: Secondary | ICD-10-CM

## 2016-12-28 LAB — GLUCOSE, CAPILLARY: Glucose-Capillary: 85 mg/dL (ref 65–99)

## 2016-12-28 MED ORDER — FLUDEOXYGLUCOSE F - 18 (FDG) INJECTION
11.9800 | Freq: Once | INTRAVENOUS | Status: AC | PRN
  Administered 2016-12-28: 11.98 via INTRAVENOUS

## 2016-12-28 NOTE — Telephone Encounter (Signed)
Patient's case reviewed in weekly cancer case conference per request of Dr. Mortimer Fries. Noted appearance of lung, liver, and spleen on CT and PET are suggestive of sarcoid. Ace level is WNL. Recommendation is for Ultrasound guided liver biopsy for suspected sarcoid. If nondiagnostic, surgical biopsy of lung is also an option.   Patient has been notified of these recommendations and is in agreement with this plan.   This note will be forwarded to Dr. Mortimer Fries and I will assist with coordination as needed.

## 2016-12-28 NOTE — Telephone Encounter (Signed)
Thank you for letting us know.  If we need to do something let me know.

## 2016-12-29 NOTE — Telephone Encounter (Signed)
Order placed for US guided BX for liver. Nothing further needed.

## 2016-12-29 NOTE — Addendum Note (Signed)
Addended by: Oscar La R on: 12/29/2016 04:30 PM   Modules accepted: Orders

## 2017-01-01 ENCOUNTER — Telehealth: Payer: Self-pay | Admitting: *Deleted

## 2017-01-01 DIAGNOSIS — H52223 Regular astigmatism, bilateral: Secondary | ICD-10-CM | POA: Diagnosis not present

## 2017-01-01 NOTE — Telephone Encounter (Signed)
After discussion with Angela Bush in [name], notified patient of arrival time for US guided liver biopsy tomorrow 01/02/17 at 8:30am. Reviewed npo after midnight, no blood thinning medications including OTC medications, and needing to have driver at discharge. Patient verbalizes understanding.

## 2017-01-02 ENCOUNTER — Other Ambulatory Visit: Payer: Self-pay | Admitting: Internal Medicine

## 2017-01-02 ENCOUNTER — Ambulatory Visit
Admission: RE | Admit: 2017-01-02 | Discharge: 2017-01-02 | Disposition: A | Discharge status: Home / Self Care | Payer: 59 | Source: Ambulatory Visit | Attending: Internal Medicine | Admitting: Internal Medicine

## 2017-01-02 DIAGNOSIS — R160 Hepatomegaly, not elsewhere classified: Secondary | ICD-10-CM | POA: Diagnosis not present

## 2017-01-02 DIAGNOSIS — K769 Liver disease, unspecified: Secondary | ICD-10-CM | POA: Diagnosis not present

## 2017-01-02 LAB — PROTIME-INR
INR: 1.05
Prothrombin Time: 13.7 seconds (ref 11.4–15.2)

## 2017-01-02 LAB — APTT: aPTT: 44 seconds — ABNORMAL HIGH (ref 24–36)

## 2017-01-02 MED ORDER — SODIUM CHLORIDE 0.9 % IV SOLN: INTRAVENOUS | Status: DC

## 2017-01-02 NOTE — Discharge Instructions (Signed)
Liver Biopsy, Care After  Refer to this sheet in the next few weeks. These instructions provide you with information on caring for yourself after your procedure. Your health care provider may also give you more specific instructions. Your treatment has been planned according to current medical practices, but problems sometimes occur. Call your health care provider if you have any problems or questions after your procedure.  What can I expect after the procedure?  After your procedure, it is typical to have the following:  · A small amount of discomfort in the area where the biopsy was done and in the right shoulder or shoulder blade.  · A small amount of bruising around the area where the biopsy was done and on the skin over the liver.  · Sleepiness and fatigue for the rest of the day.    Follow these instructions at home:  · Rest at home for 1-2 days or as directed by your health care provider.  · Have a friend or family member stay with you for at least 24 hours.  · Because of the medicines used during the procedure, you should not do the following things in the first 24 hours:  ? Drive.  ? Use machinery.  ? Be responsible for the care of other people.  ? Sign legal documents.  ? Take a bath or shower.  · There are many different ways to close and cover an incision, including stitches, skin glue, and adhesive strips. Follow your health care provider's instructions on:  ? Incision care.  ? Bandage (dressing) changes and removal.  ? Incision closure removal.  · Do not drink alcohol in the first week.  · Do not lift more than 5 pounds or play contact sports for 2 weeks after this test.  · Take medicines only as directed by your health care provider. Do not take medicine containing aspirin or non-steroidal anti-inflammatory medicines such as ibuprofen for 1 week after this test.  · It is your responsibility to get your test results.  Contact a health care provider if:  · You have increased bleeding from an incision  that results in more than a small spot of blood.  · You have redness, swelling, or increasing pain in any incisions.  · You notice a discharge or a bad smell coming from any of your incisions.  · You have a fever or chills.  Get help right away if:  · You develop swelling, bloating, or pain in your abdomen.  · You become dizzy or faint.  · You develop a rash.  · You are nauseous or vomit.  · You have difficulty breathing, feel short of breath, or feel faint.  · You develop chest pain.  · You have problems with your speech or vision.  · You have trouble balancing or moving your arms or legs.  This information is not intended to replace advice given to you by your health care provider. Make sure you discuss any questions you have with your health care provider.  Document Released: 04/28/2005 Document Revised: 03/16/2016 Document Reviewed: 12/05/2013  Elsevier Interactive Patient Education © 2017 [employer]

## 2017-01-02 NOTE — OR Nursing (Signed)
U/S confirmed to Dr Annamaria Boots liver lesions too small to obtain sample. Dr Annamaria Boots discussed findings with Dr Mortimer Fries and pt. Pt discharged post [place].

## 2017-01-02 NOTE — OR Nursing (Signed)
Dr Annamaria Boots discussed case with pt, sending pt to [place] for look for lesions prior to further prepping.

## 2017-01-03 ENCOUNTER — Ambulatory Visit (INDEPENDENT_AMBULATORY_CARE_PROVIDER_SITE_OTHER): Payer: 59 | Admitting: Cardiothoracic Surgery

## 2017-01-03 ENCOUNTER — Encounter: Payer: Self-pay | Admitting: Cardiothoracic Surgery

## 2017-01-03 VITALS — BP 102/72 | HR 82 | Temp 98.6°F | Ht 63.0 in | Wt 137.2 lb

## 2017-01-03 DIAGNOSIS — D869 Sarcoidosis, unspecified: Secondary | ICD-10-CM | POA: Diagnosis not present

## 2017-01-03 NOTE — Patient Instructions (Signed)
Please give me a call in two days so this could give me enough time to speak with the Radiologist. Hopefully they will agree in doing a percutaneous CT Guided Biopsy.

## 2017-01-03 NOTE — Progress Notes (Signed)
Patient ID: Angela Bush, female   DOB: 08/25/1981, 36 y.o.   MRN: [id number]  Chief Complaint  Patient presents with  . New Patient (Initial Visit)    Lung Nodules    Referred By Dr. Patricia Pesa Reason for Referral right lower lobe mass  HPI Location, Quality, Duration, Severity, Timing, Context, Modifying Factors, Associated Signs and Symptoms.  Angela Bush is a 36 y.o. female.  She was in her usual state of health until last winter when she developed an episode of cough with sputum production. She was placed on Levaquin and steroids and improved. She had similar episode several more times over the winterspring of this year. Each time she was treated with Levaquin and steroids and felt significantly better. She had a chest x-ray made which revealed a possible rectal nightly nodular densities in both lung fields and this ultimately led to a CT scan and referral to Dr. Patricia Pesa. The CT scan shows a coalescence of multiple pulmonary nodules in the right lower lobe with scattered nodularity throughout the lungs that was felt to be most consistent with sarcoid. In addition there are multiple lesions in the liver and spleen that again were thought to be related to sarcoid. She had an attempt at a percutaneous liver biopsy which was unsuccessful secondary to the inability to see the lesions on the basis of the ultrasound. Therefore she is referred to me for possible lung biopsy. The patient is completely asymptomatic at this point in time. She states that she's had no cough or shortness of breath. 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 She is reluctant to undergo any invasive procedure may take away from her job at this point in time. She denied any fevers or chills. Her angiotensin-converting enzyme levels are normal. Her calcium levels are normal.   Past Medical History:   Diagnosis Date  . Abnormal fetal ultrasound   . Hx of hypercholesterolemia   . SVT (supraventricular tachycardia) (HCC)     Past Surgical History:  Procedure Laterality Date  . BACK SURGERY    . DILATION AND CURETTAGE OF UTERUS  9 2015  . TONSILLECTOMY      Family History  Problem Relation Age of Onset  . Hypertension Father   . Hyperlipidemia Father     Social History Social History  Substance Use Topics  . Smoking status: Never Smoker  . Smokeless tobacco: Never Used  . Alcohol use No    Allergies  Allergen Reactions  . Latex     Current Outpatient Prescriptions  Medication Sig Dispense Refill  . albuterol (PROVENTIL HFA;VENTOLIN HFA) 108 (90 Base) MCG/ACT inhaler Inhale 2 puffs into the lungs every 6 (six) hours as needed for wheezing or shortness of breath. 1 Inhaler 0  . fluticasone-salmeterol (ADVAIR HFA) 115-21 MCG/ACT inhaler Inhale 2 puffs into the lungs 2 (two) times daily. 1 Inhaler 12   Current Facility-Administered Medications  Medication Dose Route Frequency Provider Last Rate Last Dose  . 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  She denied any fevers or chills or hemoptysis.  Blood  pressure 102/72, pulse 82, temperature 98.6 F (37 C), temperature source Oral, height 5\' 3"  (1.6 m), weight 137 lb 3.2 oz (62.2 kg), last menstrual period 12/15/2016, not currently breastfeeding.  Physical Exam CONSTITUTIONAL:  Pleasant, well-developed, well-nourished, and in no acute distress. EYES: Pupils equal and reactive to light, Sclera non-icteric EARS, NOSE, MOUTH AND THROAT:  The oropharynx was clear.  Dentition is good repair.  Oral mucosa pink and moist. LYMPH NODES:  Lymph nodes in  the neck and axillae were normal RESPIRATORY:  Lungs were clear.  Normal respiratory effort without pathologic use of accessory muscles of respiration CARDIOVASCULAR: Heart was regular without murmurs.  There were no carotid bruits. GI: The abdomen was soft, nontender, and nondistended. There were no palpable masses. There was no hepatosplenomegaly. There were normal bowel sounds in all quadrants. GU:  Rectal deferred.   MUSCULOSKELETAL:  Normal muscle strength and tone.  No clubbing or cyanosis.   SKIN:  There were no pathologic skin lesions.  There were no nodules on palpation.  There are multiple scars on her back from her prior surgery NEUROLOGIC:  Sensation is normal.  Cranial nerves are grossly intact. PSYCH:  Oriented to person, place and time.  Mood and affect are normal.  Data Reviewed Chest x-ray and CT scan.  I have personally reviewed the patient's imaging, laboratory findings and medical records.    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 I do not believe she has lymphoma. I explained to her in detail the indications and risks of thoracoscopy possible thoracotomy for lung biopsy. She would like to avoid that if at all possible. I discussed with her the potential for trying a percutaneous lung biopsy. She understands that the yield may be significantly lower but that the risks may also be lower.      Plan    I told her that I would try to get one of our radiologist to review her films again and see if a percutaneous biopsy may be possible. She will contact my office on Friday to go over those results.       Nestor Lewandowsky, MD 01/03/2017, 12:10 PM

## 2017-01-12 ENCOUNTER — Encounter
Admission: RE | Admit: 2017-01-12 | Discharge: 2017-01-12 | Disposition: A | Discharge status: Home / Self Care | Payer: 59 | Source: Ambulatory Visit | Attending: Internal Medicine | Admitting: Internal Medicine

## 2017-01-12 DIAGNOSIS — Z8249 Family history of ischemic heart disease and other diseases of the circulatory system: Secondary | ICD-10-CM | POA: Diagnosis not present

## 2017-01-12 DIAGNOSIS — Z79899 Other long term (current) drug therapy: Secondary | ICD-10-CM | POA: Insufficient documentation

## 2017-01-12 DIAGNOSIS — R918 Other nonspecific abnormal finding of lung field: Secondary | ICD-10-CM | POA: Insufficient documentation

## 2017-01-12 DIAGNOSIS — Z01810 Encounter for preprocedural cardiovascular examination: Secondary | ICD-10-CM | POA: Insufficient documentation

## 2017-01-12 DIAGNOSIS — I471 Supraventricular tachycardia: Secondary | ICD-10-CM | POA: Diagnosis not present

## 2017-01-12 DIAGNOSIS — Z9889 Other specified postprocedural states: Secondary | ICD-10-CM | POA: Diagnosis not present

## 2017-01-12 HISTORY — DX: Pneumonia, unspecified organism: J18.9

## 2017-01-12 NOTE — Patient Instructions (Signed)
  Your procedure is scheduled on: 01-17-17 (Wednesday) Report to Same Day Surgery 2nd floor medical mall [facility] Entrance-take elevator on left to 2nd floor.  Check in with surgery information desk.) To find out your arrival time please call [phone] between 1PM - 3PM on 01-16-17 (Tuesday)  Remember: Instructions that are not followed completely may result in serious medical risk, up to and including death, or upon the discretion of your surgeon and anesthesiologist your surgery may need to be rescheduled.    _x___ 1. Do not eat food or drink liquids after midnight. No gum chewing or hard candies.     __x__ 2. No Alcohol for 24 hours before or after surgery.   __x__3. No Smoking for 24 prior to surgery.   ____  4. Bring all medications with you on the day of surgery if instructed.    __x__ 5. Notify your doctor if there is any change in your medical condition     (cold, fever, infections).     Do not wear jewelry, make-up, hairpins, clips or nail polish.  Do not wear lotions, powders, or perfumes. You may wear deodorant.  Do not shave 48 hours prior to surgery. Men may shave face and neck.  Do not bring valuables to the hospital.    [facility] is not responsible for any belongings or valuables.               Contacts, dentures or bridgework may not be worn into surgery.  Leave your suitcase in the car. After surgery it may be brought to your room.  For patients admitted to the hospital, discharge time is determined by your treatment team.   Patients discharged the day of surgery will not be allowed to drive home.  You will need someone to drive you home and stay with you the night of your procedure.    Please read over the following fact sheets that you were given:     ____ Take anti-hypertensive (unless it includes a diuretic), cardiac, seizure, asthma, anti-reflux and psychiatric medicines. These include:  1. NONE  2.  3.  4.  5.  6.  ____Fleets enema or Magnesium  Citrate as directed.   _x___ Use CHG Soap or sage wipes as directed on instruction sheet   _X___ Use inhalers on the day of surgery and bring to hospital day of surgery-USE ALBUTEROL [place]  ____ Stop Metformin and Janumet 2 days prior to surgery.    ____ Take 1/2 of usual insulin dose the night before surgery and none on the morning     surgery.   ____ Follow recommendations from Cardiologist, Pulmonologist or PCP regarding stopping Aspirin, Coumadin, Pllavix ,Eliquis, Effient, or Pradaxa, and Pletal.  X____Stop Anti-inflammatories such as Advil, Aleve, Ibuprofen, Motrin, Naproxen, Naprosyn, Goodies powders or aspirin products NOW-OK to take Tylenol    _x___ Stop supplements until after surgery-STOP FISH OIL NOW AND ALL OTHER SUPPLEMENTS   ____ Bring C-Pap to the hospital.

## 2017-01-12 NOTE — Pre-Procedure Instructions (Signed)
Angela Bush  2D Echocardiogram without contrast  Order# [id number]  Ordering physician: Wellington Hampshire, MD Study date: 02/04/15  Result Notes   Notes Recorded by Emily Filbert, RN on 02/11/2015 at 1:19 PM The patient is aware of her results. Will forward a copy of the report to her OB/GYN, Melody Burr at [facility], per her request. ------  Notes Recorded by Emily Filbert, RN on 02/09/2015 at 6:00 PM I left a message for the patient to call. ------  Notes Recorded by Wellington Hampshire, MD on 02/09/2015 at 4:03 PM Inform patient that echo was normal.      Study Result   Result status: Final result              *[place]         [place]            [place], [place] [address]              [phone]  ------------------------------------------------------------------- Transthoracic Echocardiography  Patient:  Angela Bush, Angela Bush MR #:    [id number] Study Date: 02/04/2015 Gender:   F Age:    36 Height:   160 cm Weight:   63.5 kg BSA:    1.69 m^2 Pt. Status: Room:  ATTENDING  Default, Provider [phone] Candelaria Stagers, MD [place], MD SONOGRAPHER [facility] PERFORMING  [facility] [place]  cc:  ------------------------------------------------------------------- LV EF: 60% -  65%  ------------------------------------------------------------------- History:  PMH:  Palpitations and tachycardia.  ------------------------------------------------------------------- Study Conclusions  - Left ventricle: The cavity size was normal. Systolic function was normal. The estimated ejection fraction was in the range of 60% to 65%. Wall motion was normal; there were no regional wall motion abnormalities. Left ventricular diastolic function parameters were normal. - Mitral valve: There was mild regurgitation. - Right  ventricle: Systolic function was normal. - Pulmonary arteries: Systolic pressure was within the normal range.  Impressions:  - Normal study.  Transthoracic echocardiography. M-mode, complete 2D, spectral Doppler, and color Doppler. Birthdate: Patient birthdate: 04-Jan-1981. Age: Patient is 36 yr old. Sex: Gender: female. BMI: 24.8 kg/m^2. Blood pressure:   103/66 Patient status: Outpatient. Study date: Study date: 02/04/2015. Study time: 11:38 AM.  -------------------------------------------------------------------  ------------------------------------------------------------------- Left ventricle: The cavity size was normal. Systolic function was normal. The estimated ejection fraction was in the range of 60% to 65%. Wall motion was normal; there were no regional wall motion abnormalities. The transmitral flow pattern was normal. The deceleration time of the early transmitral flow velocity was normal. The pulmonary vein flow pattern was normal. The tissue Doppler parameters were normal. Left ventricular diastolic function parameters were normal.  ------------------------------------------------------------------- Aortic valve:  Trileaflet; normal thickness leaflets. Mobility was not restricted. Doppler: Transvalvular velocity was within the normal range. There was no stenosis. There was no regurgitation.  ------------------------------------------------------------------- Aorta: Aortic root: The aortic root was normal in size.  ------------------------------------------------------------------- Mitral valve:  Structurally normal valve.  Mobility was not restricted. Doppler: Transvalvular velocity was within the normal range. There was no evidence for stenosis. There was mild regurgitation.  Peak gradient (D): 4 mm Hg.  ------------------------------------------------------------------- Left atrium: The atrium was normal in  size.  ------------------------------------------------------------------- Right ventricle: The cavity size was normal. Wall thickness was normal. Systolic function was normal.  ------------------------------------------------------------------- Pulmonic valve:  Structurally normal valve.  Cusp separation was normal. Doppler: Transvalvular velocity was within the normal range. There was no evidence for stenosis. There was no  regurgitation.  ------------------------------------------------------------------- Tricuspid valve:  Structurally normal valve.  Doppler: Transvalvular velocity was within the normal range. There was mild regurgitation.  ------------------------------------------------------------------- Pulmonary artery:  The main pulmonary artery was normal-sized. Systolic pressure was within the normal range.  ------------------------------------------------------------------- Right atrium: The atrium was normal in size.  ------------------------------------------------------------------- Pericardium: There was no pericardial effusion.  ------------------------------------------------------------------- Systemic veins: Inferior vena cava: The vessel was normal in size.  ------------------------------------------------------------------- Measurements  Left ventricle               Value    Reference LV ID, ED, PLAX chordal       (H)   53.1 mm   43 - 52 LV ID, ES, PLAX chordal           31.1 mm   23 - 38 LV fx shortening, PLAX chordal       41  %   >=29 LV PW thickness, ED             5.71 mm   --------- IVS/LV PW ratio, ED             0.89     <=1.3 Stroke volume, 2D              65  ml   --------- Stroke volume/bsa, 2D            38  ml/m^2 --------- LV e&', lateral               20  cm/s  --------- LV E/e&',  lateral              4.69     --------- LV e&', medial                15  cm/s  --------- LV E/e&', medial               6.25     --------- LV e&', average               17.5 cm/s  --------- LV E/e&', average              5.36     ---------  Ventricular septum             Value    Reference IVS thickness, ED              5.11 mm   ---------  LVOT                    Value    Reference LVOT ID, S                 19  mm   --------- LVOT area                  2.84 cm^2  --------- LVOT ID                   19  mm   --------- LVOT peak velocity, S            108  cm/s  --------- LVOT mean velocity, S            75.4 cm/s  --------- LVOT VTI, S                 22.8 cm   --------- LVOT peak gradient, S            5   mm Hg --------- Stroke volume (  SV), LVOT DP         64.6 ml   --------- Stroke index (SV/bsa), LVOT DP       38.2 ml/m^2 ---------  Aorta                    Value    Reference Aortic root ID, ED             29  mm   ---------  Left atrium                 Value    Reference LA ID, A-P, ES               31  mm   --------- LA ID/bsa, A-P               1.83 cm/m^2 <=2.2 LA volume, S                56  ml   --------- LA volume/bsa, S              33.1 ml/m^2 --------- LA volume, ES, 1-p A4C           51  ml   --------- LA volume/bsa, ES, 1-p A4C         30.2 ml/m^2 --------- LA volume, ES, 1-p A2C           58  ml   --------- LA volume/bsa, ES, 1-p A2C         34.3 ml/m^2 ---------  Mitral  valve                Value    Reference Mitral E-wave peak velocity         93.8 cm/s  --------- Mitral A-wave peak velocity         47.9 cm/s  --------- Mitral deceleration time      (H)   268  ms   150 - 230 Mitral peak gradient, D           4   mm Hg --------- Mitral E/A ratio, peak           2      --------- Mitral maximal regurg velocity,       300  cm/s  --------- PISA  Pulmonary arteries             Value    Reference PA pressure, S, DP             23  mm Hg <=30  Tricuspid valve               Value    Reference Tricuspid regurg peak velocity       224  cm/s  --------- Tricuspid peak RV-RA gradient        20  mm Hg ---------  Systemic veins               Value    Reference Estimated CVP                3   mm Hg ---------  Right ventricle               Value    Reference RV pressure, S, DP             23  mm Hg <=30 RV s&', lateral, S              18.2 cm/s  ---------  Legend: (  L) and (H) mark values outside specified reference range.  ------------------------------------------------------------------- Prepared and Electronically Authenticated by  Esmond Plants, MD, [facility] 2016-04-15T16:45:05  Patient Information   Patient Name Angela Bush, Angela Bush Sex Female DOB [birth year] SSN [id number]  Reason For Exam  Priority: Routine  Dx: SVT (supraventricular tachycardia) ([place]) [I47.1 (ICD-10-CM)]  Surgical History   Surgical History   No past medical history on file.    Other Surgical History   Procedure Laterality Date Comment Source  BACK SURGERY   x4 Provider  DILATION AND CURETTAGE OF UTERUS  9 2015 x2 Provider  LASIK    Provider  TONSILLECTOMY    Provider    Performing Technologist/Nurse   Performing  Technologist/Nurse:   Implants     No active implants to display in this view.  Order-Level Documents:   There are no order-level documents.  Encounter-Level Documents - 02/04/2015:   Electronic signature on 02/04/2015 11:28 AM      Printable Result Report   Result Report

## 2017-01-16 ENCOUNTER — Encounter: Payer: Self-pay | Admitting: *Deleted

## 2017-01-16 ENCOUNTER — Institutional Professional Consult (permissible substitution): Payer: Self-pay | Admitting: Internal Medicine

## 2017-01-17 ENCOUNTER — Ambulatory Visit: Payer: 59 | Admitting: Certified Registered Nurse Anesthetist

## 2017-01-17 ENCOUNTER — Ambulatory Visit: Admit: 2017-01-17 | Payer: 59 | Admitting: Internal Medicine

## 2017-01-17 ENCOUNTER — Ambulatory Visit
Admission: RE | Admit: 2017-01-17 | Discharge: 2017-01-17 | Disposition: A | Discharge status: Home / Self Care | Payer: 59 | Source: Ambulatory Visit | Attending: Internal Medicine | Admitting: Internal Medicine

## 2017-01-17 ENCOUNTER — Ambulatory Visit: Payer: 59

## 2017-01-17 ENCOUNTER — Encounter
Admission: RE | Disposition: A | Discharge status: Home / Self Care | Payer: Self-pay | Source: Ambulatory Visit | Attending: Internal Medicine

## 2017-01-17 ENCOUNTER — Encounter: Payer: Self-pay | Admitting: *Deleted

## 2017-01-17 DIAGNOSIS — E7800 Pure hypercholesterolemia, unspecified: Secondary | ICD-10-CM | POA: Diagnosis not present

## 2017-01-17 DIAGNOSIS — Z7951 Long term (current) use of inhaled steroids: Secondary | ICD-10-CM | POA: Insufficient documentation

## 2017-01-17 DIAGNOSIS — R938 Abnormal findings on diagnostic imaging of other specified body structures: Secondary | ICD-10-CM

## 2017-01-17 DIAGNOSIS — R9389 Abnormal findings on diagnostic imaging of other specified body structures: Secondary | ICD-10-CM

## 2017-01-17 DIAGNOSIS — Z8701 Personal history of pneumonia (recurrent): Secondary | ICD-10-CM | POA: Diagnosis not present

## 2017-01-17 DIAGNOSIS — Z79899 Other long term (current) drug therapy: Secondary | ICD-10-CM | POA: Diagnosis not present

## 2017-01-17 DIAGNOSIS — D860 Sarcoidosis of lung: Secondary | ICD-10-CM | POA: Diagnosis not present

## 2017-01-17 DIAGNOSIS — Z7689 Persons encountering health services in other specified circumstances: Secondary | ICD-10-CM | POA: Diagnosis not present

## 2017-01-17 DIAGNOSIS — R911 Solitary pulmonary nodule: Secondary | ICD-10-CM | POA: Diagnosis not present

## 2017-01-17 HISTORY — PX: ELECTROMAGNETIC NAVIGATION BROCHOSCOPY: SHX5369

## 2017-01-17 LAB — POCT PREGNANCY, URINE: Preg Test, Ur: NEGATIVE

## 2017-01-17 SURGERY — ELECTROMAGNETIC NAVIGATION BRONCHOSCOPY
Anesthesia: General

## 2017-01-17 MED ORDER — ROCURONIUM BROMIDE 50 MG/5ML IV SOLN
INTRAVENOUS | Status: AC
  Filled 2017-01-17: qty 1

## 2017-01-17 MED ORDER — SUGAMMADEX SODIUM 200 MG/2ML IV SOLN
INTRAVENOUS | Status: DC | PRN
Start: 2017-01-17 — End: 2017-01-17
  Administered 2017-01-17: 200 mg via INTRAVENOUS

## 2017-01-17 MED ORDER — FENTANYL CITRATE (PF) 100 MCG/2ML IJ SOLN
INTRAMUSCULAR | Status: DC | PRN
Start: 2017-01-17 — End: 2017-01-17
  Administered 2017-01-17 (×2): 50 ug via INTRAVENOUS
  Administered 2017-01-17: .2 ug via INTRAVENOUS

## 2017-01-17 MED ORDER — FAMOTIDINE 20 MG PO TABS
ORAL_TABLET | ORAL | Status: AC
  Filled 2017-01-17: qty 1

## 2017-01-17 MED ORDER — MIDAZOLAM HCL 2 MG/2ML IJ SOLN
INTRAMUSCULAR | Status: AC
  Filled 2017-01-17: qty 2

## 2017-01-17 MED ORDER — SODIUM CHLORIDE 0.9 % IV SOLN
INTRAVENOUS | Status: AC
  Filled 2017-01-17: qty 2000

## 2017-01-17 MED ORDER — LACTATED RINGERS IV SOLN
INTRAVENOUS | Status: DC
  Administered 2017-01-17 (×2): via INTRAVENOUS

## 2017-01-17 MED ORDER — SUCCINYLCHOLINE CHLORIDE 20 MG/ML IJ SOLN
INTRAMUSCULAR | Status: DC | PRN
Start: 2017-01-17 — End: 2017-01-17
  Administered 2017-01-17: 100 mg via INTRAVENOUS

## 2017-01-17 MED ORDER — PROPOFOL 10 MG/ML IV BOLUS
INTRAVENOUS | Status: AC
  Filled 2017-01-17: qty 20

## 2017-01-17 MED ORDER — SUCCINYLCHOLINE CHLORIDE 20 MG/ML IJ SOLN
INTRAMUSCULAR | Status: AC
  Filled 2017-01-17: qty 1

## 2017-01-17 MED ORDER — PROPOFOL 500 MG/50ML IV EMUL
INTRAVENOUS | Status: DC | PRN
  Administered 2017-01-17: 175 ug/kg/min via INTRAVENOUS

## 2017-01-17 MED ORDER — ROCURONIUM BROMIDE 100 MG/10ML IV SOLN
INTRAVENOUS | Status: DC | PRN
  Administered 2017-01-17: 5 mg via INTRAVENOUS
  Administered 2017-01-17 (×2): 10 mg via INTRAVENOUS

## 2017-01-17 MED ORDER — FENTANYL CITRATE (PF) 100 MCG/2ML IJ SOLN
INTRAMUSCULAR | Status: AC
  Filled 2017-01-17: qty 2

## 2017-01-17 MED ORDER — FENTANYL CITRATE (PF) 100 MCG/2ML IJ SOLN
25.0000 ug | INTRAMUSCULAR | Status: DC | PRN
  Administered 2017-01-17: 25 ug via INTRAVENOUS

## 2017-01-17 MED ORDER — PROPOFOL 500 MG/50ML IV EMUL
INTRAVENOUS | Status: AC
  Filled 2017-01-17: qty 50

## 2017-01-17 MED ORDER — SUGAMMADEX SODIUM 500 MG/5ML IV SOLN: INTRAVENOUS | Status: DC | PRN

## 2017-01-17 MED ORDER — FAMOTIDINE 20 MG PO TABS
20.0000 mg | ORAL_TABLET | Freq: Once | ORAL | Status: AC
  Administered 2017-01-17: 20 mg via ORAL

## 2017-01-17 MED ORDER — PROPOFOL 10 MG/ML IV BOLUS
INTRAVENOUS | Status: DC | PRN
  Administered 2017-01-17: 40 mg via INTRAVENOUS
  Administered 2017-01-17: 150 mg via INTRAVENOUS

## 2017-01-17 MED ORDER — SUGAMMADEX SODIUM 200 MG/2ML IV SOLN
INTRAVENOUS | Status: AC
  Filled 2017-01-17: qty 2

## 2017-01-17 MED ORDER — MIDAZOLAM HCL 2 MG/2ML IJ SOLN
INTRAMUSCULAR | Status: DC | PRN
  Administered 2017-01-17 (×2): 1 mg via INTRAVENOUS

## 2017-01-17 MED ORDER — ONDANSETRON HCL 4 MG/2ML IJ SOLN
INTRAMUSCULAR | Status: DC | PRN
  Administered 2017-01-17: 4 mg via INTRAVENOUS

## 2017-01-17 MED ORDER — LIDOCAINE HCL (CARDIAC) 20 MG/ML IV SOLN
INTRAVENOUS | Status: DC | PRN
  Administered 2017-01-17: 30 mg via INTRAVENOUS

## 2017-01-17 MED ORDER — ONDANSETRON HCL 4 MG/2ML IJ SOLN: 4.0000 mg | Freq: Once | INTRAMUSCULAR | Status: DC | PRN

## 2017-01-17 MED ORDER — LIDOCAINE HCL (CARDIAC) 20 MG/ML IV SOLN: INTRAVENOUS | Status: DC | PRN

## 2017-01-17 NOTE — Anesthesia Procedure Notes (Signed)
Procedure Name: Intubation Performed by: Shaul Trautman, Joelene Millin

## 2017-01-17 NOTE — Interval H&P Note (Signed)
History and Physical Interval Note:  01/17/2017 9:37 AM  Angela Bush  has presented today for surgery, with the diagnosis of lung nodule  The various methods of treatment have been discussed with the patient and family. After consideration of risks, benefits and other options for treatment, the patient has consented to  Procedure(s): ELECTROMAGNETIC NAVIGATION BRONCHOSCOPY (N/A) as a surgical intervention .  The patient's history has been reviewed, patient examined, no change in status, stable for surgery.  I have reviewed the patient's chart and labs.  Questions were answered to the patient's satisfaction.     [name]

## 2017-01-17 NOTE — Anesthesia Postprocedure Evaluation (Signed)
Anesthesia Post Note  Patient: NALINI ALCARAZ  Procedure(s) Performed: Procedure(s) (LRB): ELECTROMAGNETIC NAVIGATION BRONCHOSCOPY (N/A)  Patient location during evaluation: PACU Anesthesia Type: General Level of consciousness: awake and alert Pain management: pain level controlled Vital Signs Assessment: post-procedure vital signs reviewed and stable Respiratory status: spontaneous breathing and respiratory function stable Cardiovascular status: stable Anesthetic complications: no     Last Vitals:  Vitals:   01/17/17 1200 01/17/17 1209  BP: 100/74   Pulse: 83 88  Resp: 20 16  Temp:      Last Pain:  Vitals:   01/17/17 1149  TempSrc:   PainSc: 0-No pain                 Rayne Loiseau K

## 2017-01-17 NOTE — Op Note (Signed)
Electromagnetic Navigation Bronchoscopy: Indication: RLL nodular opacity  Preoperative Diagnosis: RLL nodular opacity Post Procedure Diagnosis:  RLL nodular opacity Consent: verbal/written Risks and benefits explained in detail including risk of infection, bleeding, respiratory failure and death.   Hand washing performed prior to starting the procedure.   Type of Anesthesia: see Anesthesiology records .   Procedure Performed:  Virtual Bronchoscopy with Multi-planar Image analysis, 3-D reconstruction of coronal, sagittal and multi-planar images for the purposes of planning real-time bronchoscopy using the iLogic Electromagnetic Navigation Bronchoscopy System (superDimension)..   Description of Procedure: After obtaining informed consent from the patient, the above sedative and anesthetic measures were carried out, flexible fiberoptic bronchoscope was inserted via an oral bite block. Posterior pharynx was clear. The 2 vocal cords were easily traversed after application of local anesthetic.  The virtual camera was then placed into the central portion of the trachea. The trachea itself was inspected.  The main carina, right and left midstem bronchus and all the segmental and subsegmental airways by virtual bronchoscopy were brieftly inspected.  The camera was directed to standard registration points at the following centers: main carina, right upper lobe bronchus, right lower lobe bronchus, right middle lobe bronchus, left upper lobe bronchus, and the left lower lobe bronchus. This data was transferred to the i-Logic ENB system for real-time bronchoscopy.   The scope was navigated to the RLL opacity where I was able to obtain the following tissue samples using Flouroscopy  Specimans Obtained:  TRANSBRONCHIAL Fine Needle Aspirations 21G times: 3  Forceps Biopsy times:9    Fluoroscopy:  Fluoroscopy was utilized during the course of this procedure to assure that biopsies were taken in a safe  manner under fluoroscopic guidance with no spot films required.   Complications:none  Estimated Blood Loss: none   Monitoring:  The patient was monitored with continuous oximetry and received supplemental nasal cannula oxygen throughout the procedure. In addition, serial blood pressure measurements and continuous electrocardiography showed these physiologic parameters to remain tolerable throughout the procedure.   Assessment and Plan/Additional Comments:follow up pathology report    Corrin Parker, M.D.  [facility]  Medical Director [place] Director [facility] Cardio-Pulmonary Department

## 2017-01-17 NOTE — Anesthesia Procedure Notes (Signed)
Procedure Name: Intubation Date/Time: 01/17/2017 10:43 AM Performed by: Allean Found Pre-anesthesia Checklist: Patient identified, Emergency Drugs available, Suction available, Patient being monitored and Timeout performed Patient Re-evaluated:Patient Re-evaluated prior to inductionOxygen Delivery Method: Circle system utilized Preoxygenation: Pre-oxygenation with 100% oxygen Intubation Type: IV induction Ventilation: Mask ventilation without difficulty Laryngoscope Size: Mac and 3 Grade View: Grade II Tube type: Oral Placement Confirmation: ETT inserted through vocal cords under direct vision,  positive ETCO2 and breath sounds checked- equal and bilateral Secured at: 23 cm Tube secured with: Tape Dental Injury: Teeth and Oropharynx as per pre-operative assessment

## 2017-01-17 NOTE — H&P (View-Only) (Signed)
[place] [facility] Consultation      Date: 12/25/2016,   MRN# [id number] KAMARA Bush [birth year] Code Status:  Code Status History    Date Active Date Inactive Code Status Order ID Comments User Context   06/21/2015  6:48 PM 06/23/2015  1:54 PM Full Code [id number]  Evonnie Pat, CNM Inpatient   06/21/2015  1:13 PM 06/21/2015  6:48 PM Full Code [id number]  Ples Specter, RN Inpatient   06/21/2015  9:07 AM 06/21/2015  1:13 PM Full Code [id number]  Ples Specter, RN Inpatient     Hosp day:@LENGTHOFSTAYDAYS @ Referring MD: @ATDPROV @     PCP:      AdmissionWeight: 136 lb (61.7 kg)                 CurrentWeight: 136 lb (61.7 kg) Angela Bush is a 36 y.o. old female seen in consultation for abnormal CT chest at the request of Dr. Ronnald Ramp.     CHIEF COMPLAINT:   I had pneumonia   HISTORY OF PRESENT ILLNESS   36 yo pleasant white female seen today for several reasons  1.patient has had recurrent bouts of URI and chest congestion  First episode 06/2016-given z pak then levaquin and prednisone-got better within days Second episode 08/2016 dx with pneumonia given levaquin and steroids got better within days 3rd episode Jan 2018-CXr shows abnormal opacity RT lung given levaquin and steroids-got better within days  2.Patient with RUL opacity/nodular appearance  RLL bronchiectasis with probable previous inflammation/infection  CT chest reviewed with patient: there are also liver and spleen findings with and adenopathy, also RUL nodular opacity  Currently, patient has no acute resp issues at this time. No signs of infection at this time No lower ext swelling no skin changes  2.patient   PAST MEDICAL HISTORY   Past Medical History:  Diagnosis Date  . Abnormal fetal ultrasound   . Hx of hypercholesterolemia   . SVT (supraventricular tachycardia) ([place])      SURGICAL HISTORY   Past Surgical History:  Procedure Laterality Date  . BACK SURGERY    .  DILATION AND CURETTAGE OF UTERUS  9 2015  . TONSILLECTOMY       FAMILY HISTORY   Family History  Problem Relation Age of Onset  . Hypertension Father   . Hyperlipidemia Father      SOCIAL HISTORY   Social History  Substance Use Topics  . Smoking status: Never Smoker  . Smokeless tobacco: Never Used  . Alcohol use No     MEDICATIONS    Home Medication:  Current Outpatient Rx  . Order #: [id number] Class: Normal  . Order #: [id number] Class: Normal    Current Medication:  Current Outpatient Prescriptions:  .  albuterol (PROVENTIL HFA;VENTOLIN HFA) 108 (90 Base) MCG/ACT inhaler, Inhale 2 puffs into the lungs every 6 (six) hours as needed for wheezing or shortness of breath. (Patient not taking: Reported on 12/14/2016), Disp: 1 Inhaler, Rfl: 0 .  fluticasone-salmeterol (ADVAIR HFA) 115-21 MCG/ACT inhaler, Inhale 2 puffs into the lungs 2 (two) times daily. (Patient not taking: Reported on 12/14/2016), Disp: 1 Inhaler, Rfl: 12  Current Facility-Administered Medications:  .  ipratropium-albuterol (DUONEB) 0.5-2.5 (3) MG/3ML nebulizer solution 3 mL, 3 mL, Nebulization, Once, Versie Starks, PA-C    ALLERGIES   Latex     REVIEW OF SYSTEMS   Review of Systems  Constitutional: Negative for chills, diaphoresis, fever, malaise/fatigue and weight loss.  HENT: Negative for congestion and hearing loss.  Eyes: Negative for blurred vision and double vision.  Respiratory: Negative for cough, hemoptysis, sputum production, shortness of breath and wheezing.   Cardiovascular: Negative for chest pain, palpitations and orthopnea.  Gastrointestinal: Negative for abdominal pain, blood in stool, constipation, diarrhea, heartburn, nausea and vomiting.  Genitourinary: Negative for dysuria and urgency.  Musculoskeletal: Negative for back pain, myalgias and neck pain.  Skin: Negative for rash.  Neurological: Negative for dizziness, tingling, tremors, weakness and headaches.    Endo/Heme/Allergies: Does not bruise/bleed easily.  Psychiatric/Behavioral: Negative for depression, substance abuse and suicidal ideas.  All other systems reviewed and are negative.    VS: BP 118/70 (BP Location: Right Arm, Cuff Size: Normal)   Pulse 86   Wt 136 lb (61.7 kg)   LMP 11/27/2016   SpO2 99%   BMI 24.09 kg/m      PHYSICAL EXAM  Physical Exam  Constitutional: She is oriented to person, place, and time. She appears well-developed and well-nourished. No distress.  HENT:  Head: Normocephalic and atraumatic.  Mouth/Throat: No oropharyngeal exudate.  Eyes: EOM are normal. Pupils are equal, round, and reactive to light. No scleral icterus.  Neck: Normal range of motion. Neck supple.  Cardiovascular: Normal rate, regular rhythm and normal heart sounds.   No murmur heard. Pulmonary/Chest: No stridor. No respiratory distress. She has no wheezes.  Abdominal: Soft. Bowel sounds are normal.  Musculoskeletal: Normal range of motion. She exhibits no edema.  Neurological: She is alert and oriented to person, place, and time. No cranial nerve deficit.  Skin: Skin is warm. She is not diaphoretic.  Psychiatric: She has a normal mood and affect.           IMAGING    Dg Chest 2 View  Result Date: 12/11/2016 CLINICAL DATA:  Follow-up pneumonia. EXAM: CHEST  2 VIEW COMPARISON:  Multiple chest x-rays since October 2013 FINDINGS: Pulmonary opacity seen in the right mid lung, new since 2013. It is less conspicuous when compared October 2017 but more prominent when compared January 2018. No other interval changes or acute abnormalities. IMPRESSION: Right mid lung opacity as above with a waxing and waning course. Recommend a short-term follow-up in 6 weeks. If it does not resolve on that study, recommend CT imaging. Electronically Signed   By: Dorise Bullion III M.D   On: 12/11/2016 18:42   Ct Chest W Contrast  Result Date: 12/20/2016 CLINICAL DATA:  Recurrent pneumonia.  Abnormal  chest radiograph. EXAM: CT CHEST WITH CONTRAST TECHNIQUE: Multidetector CT imaging of the chest was performed during intravenous contrast administration. CONTRAST:  76mL ISOVUE-300 IOPAMIDOL (ISOVUE-300) INJECTION 61% COMPARISON:  Chest radiograph 12/11/2016. FINDINGS: Cardiovascular: Vascular structures are unremarkable. Heart size normal. No pericardial effusion. Mediastinum/Nodes: No pathologically enlarged mediastinal, hilar or axillary lymph nodes. Esophagus is grossly unremarkable. Lungs/Pleura: Upper and midlung zone predominant peribronchovascular nodularity and mild architectural distortion. Fissural nodularity is noted as well. No pleural fluid. Airway is unremarkable. Upper Abdomen: There are multiple low-attenuation lesions in the liver which measure up to approximately 10 mm in the right hepatic lobe. Visualized portions of the adrenal glands and kidneys are unremarkable. There are multiple low-attenuation lesions in the spleen, measuring up to 1.5 cm. Visualized portions of the pancreas and stomach are grossly unremarkable. Small lymph nodes are seen in the upper abdomen. Musculoskeletal: Scoliosis. 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 No worrisome lytic or sclerotic lesions. IMPRESSION: Upper/ mid lung zone predominant perilymphatic nodularity together with multiple low-attenuation lesions in the liver and spleen, most  consistent with sarcoid. Electronically Signed   By: Lorin Picket M.D.   On: 12/20/2016 13:21    I have Independently reviewed images of 12/20/16 CT can 12/25/2016 Interpretation:RUL opacity, RLL bronchiectasis   ASSESSMENT/PLAN   36 yo white female with recurrent bouts of chest congestion/acute Bronchitis with abnormal CT scan with RUL opacity There is a possibility that this could be inflammatory process ie. Sarcoidosis At this time, patient still in process of diagnostic evaluation and work up  At this time, it would be beneficial for patient to obtain PET  scan for further assessment  1.will obtain PET scan 2.check CBC, CMP 3.check ACE levels and calcium levels 4.check Pregnancy test   Follow up after tests completed     Patient/Family are satisfied with Plan of action and management. All questions answered  Corrin Parker, M.D.  [facility]  Medical Director [place] Director [facility] Cardio-Pulmonary Department

## 2017-01-17 NOTE — Transfer of Care (Signed)
Immediate Anesthesia Transfer of Care Note  Patient: Angela Bush  Procedure(s) Performed: Procedure(s): ELECTROMAGNETIC NAVIGATION BRONCHOSCOPY (N/A)  Patient Location: PACU  Anesthesia Type:General  Level of Consciousness: awake  Airway & Oxygen Therapy: Patient Spontanous Breathing and Patient connected to face mask oxygen  Post-op Assessment: Report given to RN and Post -op Vital signs reviewed and stable  Post vital signs: Reviewed and stable  Last Vitals:  Vitals:   01/17/17 0908 01/17/17 1145  BP: 116/65 (!) 97/57  Pulse: 71 92  Resp: 16 14  Temp: 36.8 C 36.4 C    Last Pain:  Vitals:   01/17/17 1149  TempSrc:   PainSc: 0-No pain         Complications: No apparent anesthesia complications

## 2017-01-17 NOTE — Discharge Instructions (Signed)

## 2017-01-17 NOTE — Anesthesia Post-op Follow-up Note (Cosign Needed)
Anesthesia QCDR form completed.        

## 2017-01-17 NOTE — Anesthesia Preprocedure Evaluation (Signed)
Anesthesia Evaluation  Patient identified by MRN, date of birth, ID band Patient awake    Reviewed: Allergy & Precautions, NPO status , Patient's Chart, lab work & pertinent test results  History of Anesthesia Complications Negative for: history of anesthetic complications  Airway Mallampati: II       Dental   Pulmonary neg pulmonary ROS,           Cardiovascular negative cardio ROS  + dysrhythmias Supra Ventricular Tachycardia      Neuro/Psych negative neurological ROS     GI/Hepatic negative GI ROS, Neg liver ROS,   Endo/Other  negative endocrine ROS  Renal/GU negative Renal ROS     Musculoskeletal   Abdominal   Peds  Hematology negative hematology ROS (+)   Anesthesia Other Findings   Reproductive/Obstetrics                             Anesthesia Physical Anesthesia Plan  ASA: II  Anesthesia Plan: General   Post-op Pain Management:    Induction: Intravenous  Airway Management Planned: Oral ETT  Additional Equipment:   Intra-op Plan:   Post-operative Plan:   Informed Consent: I have reviewed the patients History and Physical, chart, labs and discussed the procedure including the risks, benefits and alternatives for the proposed anesthesia with the patient or authorized representative who has indicated his/her understanding and acceptance.     Plan Discussed with:   Anesthesia Plan Comments:         Anesthesia Quick Evaluation

## 2017-01-18 ENCOUNTER — Encounter: Payer: Self-pay | Admitting: Internal Medicine

## 2017-01-19 LAB — SURGICAL PATHOLOGY

## 2017-01-19 LAB — CYTOLOGY - NON PAP

## 2017-01-23 ENCOUNTER — Telehealth: Payer: Self-pay | Admitting: Internal Medicine

## 2017-01-23 NOTE — Telephone Encounter (Signed)
Pt informed message has been sent to DS since DK is not available this week. Informed I would call once I heard from provider.

## 2017-01-23 NOTE — Telephone Encounter (Signed)
Patient wants to know if someone can call her today.

## 2017-01-23 NOTE — Telephone Encounter (Signed)
DK done ENB on 01/17/17 and pt is calling wanting results. Please advise.

## 2017-01-23 NOTE — Telephone Encounter (Signed)
Pt would like bronch results. Please call.

## 2017-01-24 NOTE — Telephone Encounter (Signed)
I ran into her in the ER and reviewed the CT results and biopsy results with her  Angela Bush

## 2017-01-29 ENCOUNTER — Telehealth: Payer: Self-pay | Admitting: *Deleted

## 2017-01-29 DIAGNOSIS — D7389 Other diseases of spleen: Secondary | ICD-10-CM

## 2017-01-29 DIAGNOSIS — K7689 Other specified diseases of liver: Secondary | ICD-10-CM

## 2017-01-29 DIAGNOSIS — D739 Disease of spleen, unspecified: Secondary | ICD-10-CM

## 2017-01-29 MED ORDER — AMOXICILLIN-POT CLAVULANATE 875-125 MG PO TABS: 1.0000 | ORAL_TABLET | Freq: Two times a day (BID) | ORAL | 0 refills | Status: DC

## 2017-01-29 MED ORDER — PREDNISONE 20 MG PO TABS: 40.0000 mg | ORAL_TABLET | Freq: Every day | ORAL | 0 refills | Status: DC

## 2017-01-29 NOTE — Telephone Encounter (Signed)
Spoke with pt and informed her medication sent and GI ref placed. Pt states she will schedule her GI appt due to her schedule.

## 2017-01-29 NOTE — Telephone Encounter (Signed)
-----   Message from [name], MD sent at 01/29/2017  9:47 AM EDT ----- Can you please send scripts for Jamilynn? Prednisone 40 mg daily for 10 days Augmentin 875 mg BID for 10 days  Also please place  GI consult for Dr. Vicente Males for Liver and spleen nodules

## 2017-02-22 ENCOUNTER — Other Ambulatory Visit: Payer: Self-pay

## 2017-02-22 ENCOUNTER — Other Ambulatory Visit
Admission: RE | Admit: 2017-02-22 | Discharge: 2017-02-22 | Disposition: A | Discharge status: Home / Self Care | Payer: 59 | Source: Ambulatory Visit | Attending: Gastroenterology | Admitting: Gastroenterology

## 2017-02-22 ENCOUNTER — Ambulatory Visit (INDEPENDENT_AMBULATORY_CARE_PROVIDER_SITE_OTHER): Payer: 59 | Admitting: Gastroenterology

## 2017-02-22 ENCOUNTER — Encounter: Payer: Self-pay | Admitting: Gastroenterology

## 2017-02-22 VITALS — BP 101/66 | HR 89 | Temp 98.7°F | Ht 63.0 in | Wt 135.6 lb

## 2017-02-22 DIAGNOSIS — K769 Liver disease, unspecified: Secondary | ICD-10-CM | POA: Insufficient documentation

## 2017-02-22 LAB — URINALYSIS, ROUTINE W REFLEX MICROSCOPIC
BILIRUBIN URINE: NEGATIVE
GLUCOSE, UA: NEGATIVE mg/dL
Hgb urine dipstick: NEGATIVE
KETONES UR: NEGATIVE mg/dL
Leukocytes, UA: NEGATIVE
Nitrite: NEGATIVE
PH: 7 (ref 5.0–8.0)
Protein, ur: NEGATIVE mg/dL
Specific Gravity, Urine: 1.008 (ref 1.005–1.030)

## 2017-02-22 LAB — C-REACTIVE PROTEIN: CRP: <0.8 mg/dL (ref <1.0)

## 2017-02-22 LAB — SEDIMENTATION RATE: Sed Rate: 8 mm/hr (ref 0–20)

## 2017-02-22 NOTE — Progress Notes (Signed)
Gastroenterology Consultation  Referring Provider:     Juline Patch, MD Primary Care Physician:  Otilio Miu, MD Primary Gastroenterologist:  Dr. Jonathon Bellows  Reason for Consultation:     Liver lesions         HPI:   Angela Bush is a 36 y.o. y/o female referred for consultation & management  by Dr. Otilio Miu, MD.     She has been referred for evaluation of a liver nodule.She was doing well till last winder when she developed cough with sputum production , commenced on antibiotics, felt better, xray showed nodular densities in lungs and during evaluation found on a CT scan of the cest multiple pulmonary nodules  . Dr Genevive Bi has recently discussed biopsy of the lung mass . Presently not too keen.   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   PET scan 12/28/2016- Multiple lesions within liver and spleen most consistent with sarcoid. No hypermetabolic nodes in the abdomen or pelvis  USG abdomen 01/02/17- lesions seen on CT scan not seen on ultrasound.   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   Labs : INR 1.05, CMP-normal HB 12.4.ACE normal .  She denies any weight los, no change in bowel habits, No travel outside the country, not on birth control, no alcohol consumption, does not smoke.   She showed me her TB quantiferon labs on her phone that were negative.   Past Medical History:  Diagnosis Date  . Abnormal fetal ultrasound   . Hx of hypercholesterolemia   . Pneumonia   . SVT (supraventricular tachycardia) (HCC)     Past Surgical History:  Procedure Laterality Date  . BACK SURGERY     x4  . DILATION AND CURETTAGE OF UTERUS  9 2015   x2  . ELECTROMAGNETIC NAVIGATION BROCHOSCOPY N/A 01/17/2017   Procedure: ELECTROMAGNETIC NAVIGATION BRONCHOSCOPY;  Surgeon: [name], MD;   Location: [facility] ORS;  Service: Cardiopulmonary;  Laterality: N/A;  . LASIK    . TONSILLECTOMY      Prior to Admission medications   Medication Sig Start Date End Date Taking? Authorizing Provider  Multiple Vitamin (MULTIVITAMIN WITH MINERALS) TABS tablet Take 1 tablet by mouth 2 (two) times daily.   Yes Historical Provider, MD  Omega-3 Fatty Acids (FISH OIL PO) Take 1 tablet by mouth daily.   Yes Historical Provider, MD  albuterol (PROVENTIL HFA;VENTOLIN HFA) 108 (90 Base) MCG/ACT inhaler Inhale 2 puffs into the lungs every 6 (six) hours as needed for wheezing or shortness of breath. Patient not taking: Reported on 02/22/2017 06/08/16   Versie Starks, PA-C  fluticasone-salmeterol (ADVAIR HFA) [id number] MCG/ACT inhaler Inhale 2 puffs into the lungs 2 (two) times daily. Patient not taking: Reported on 02/22/2017 08/14/16   Versie Starks, PA-C  ibuprofen (ADVIL,MOTRIN) 200 MG tablet Take 400-800 mg by mouth every 8 (eight) hours as needed (for pain/headache.).    Historical Provider, MD  methylPREDNISolone (MEDROL DOSEPAK) 4 MG TBPK tablet  11/15/16   Historical Provider, MD    Family History  Problem Relation Age of Onset  . Hypertension Father   . Hyperlipidemia Father      Social History  Substance Use Topics  . Smoking status: Never Smoker  . Smokeless tobacco: Never Used  . Alcohol use Yes  Comment: rare    Allergies as of 02/22/2017 - Review Complete 02/22/2017  Allergen Reaction Noted  . Latex Itching, Swelling, and Other (See Comments) 01/18/2015    Review of Systems:    All systems reviewed and negative except where noted in HPI.   Physical Exam:  BP 101/66 (BP Location: Left Arm, Patient Position: Sitting, Cuff Size: Normal)   Pulse 89   Temp 98.7 F (37.1 C) (Oral)   Ht '5\' 3"'  (1.6 m)   Wt 135 lb 9.6 oz (61.5 kg)   BMI 24.02 kg/m  No LMP recorded. Psych:  Alert and cooperative. Normal mood and affect. (panya MA chaperone in the room) General:   Alert,   Well-developed, well-nourished, pleasant and cooperative in NAD Head:  Normocephalic and atraumatic. Eyes:  Sclera clear, no icterus.   Conjunctiva pink. Ears:  Normal auditory acuity. Nose:  No deformity, discharge, or lesions. Mouth:  No deformity or lesions,oropharynx pink & moist. Neck:  Supple; no masses or thyromegaly. Lungs:  Respirations even and unlabored.  Clear throughout to auscultation.   No wheezes, crackles, or rhonchi. No acute distress. Heart:  Regular rate and rhythm; no murmurs, clicks, rubs, or gallops. Abdomen:  Normal bowel sounds.  No bruits.  Soft, non-tender and non-distended without masses, hepatosplenomegaly or hernias noted.  No guarding or rebound tenderness.    Msk:  Symmetrical without gross deformities. Good, equal movement & strength bilaterally. Pulses:  Normal pulses noted. Extremities:  No clubbing or edema.  No cyanosis. Neurologic:  Alert and oriented x3;  grossly normal neurologically. Skin:  Intact without significant lesions or rashes. No jaundice. Lymph Nodes:  No significant cervical adenopathy. Psych:  Alert and cooperative. Normal mood and affect.  Imaging Studies: No results found.  Assessment and Plan:   Angela Bush is a 36 y.o. y/o female has been referred for liver lesions.The biopsy from lung shows a granuloma which can be seen in mycobacterial, fungal,parasitic infections. She does not have any symptoms of fevers, weight loss, lower gi symptoms or abdominal pain or exotic travel. GI sarcoidosis is diagnosis of exclusion . 50-60% of patients with sarcoidosis have liver lesions. ASymptomatic patients generally do not require any treatment.   Plan  1. Monitor LFT's closeley every few months  2. I will obtain a multiphase MRI liver to characterize the liver lesions better( she has rods in her back and will check with MRI if ok otherwise will obtain CT scan of the liver multiphase 3. I will also obtain a ESR and ENR panel to check for  vasculiutis such as churgs strauss or wegners , urine analysis.   Follow up in 6 weeks   Dr Jonathon Bellows MD

## 2017-02-23 LAB — PAN-ANCA
ANCA Proteinase 3: <3.5 U/mL (ref 0.0–3.5)
C-ANCA: <1:20 {titer}
P-ANCA: <1:20 {titer}

## 2017-02-23 LAB — C4 COMPLEMENT: COMPLEMENT C4, BODY FLUID: 24 mg/dL (ref 14–44)

## 2017-02-23 LAB — C3 COMPLEMENT: C3 Complement: 91 mg/dL (ref 82–167)

## 2017-02-27 ENCOUNTER — Telehealth: Payer: Self-pay

## 2017-02-27 NOTE — Telephone Encounter (Signed)
-----   Message from Jonathon Bellows, MD sent at 02/25/2017  9:24 PM EDT ----- The autoantibody panel, compliment levels ,Urine and CRP were normal

## 2017-02-27 NOTE — Telephone Encounter (Signed)
LVM for patient callback to advise results per Dr. Vicente Males.   The autoantibody panel, compliment levels ,Urine and CRP were normal

## 2017-03-07 ENCOUNTER — Ambulatory Visit
Admission: RE | Admit: 2017-03-07 | Discharge: 2017-03-07 | Disposition: A | Discharge status: Home / Self Care | Payer: 59 | Source: Ambulatory Visit | Attending: Gastroenterology | Admitting: Gastroenterology

## 2017-03-07 DIAGNOSIS — K769 Liver disease, unspecified: Secondary | ICD-10-CM | POA: Insufficient documentation

## 2017-03-07 DIAGNOSIS — K7689 Other specified diseases of liver: Secondary | ICD-10-CM | POA: Diagnosis not present

## 2017-03-07 MED ORDER — GADOXETATE DISODIUM 0.25 MMOL/ML IV SOLN
10.0000 mL | Freq: Once | INTRAVENOUS | Status: AC | PRN
  Administered 2017-03-07: 6 mL via INTRAVENOUS

## 2017-03-15 ENCOUNTER — Telehealth: Payer: Self-pay

## 2017-03-15 NOTE — Telephone Encounter (Signed)
Advised patient of results per Dr. Vicente Males.   Inform results are very similar to CT scan findings and suggestive of sarcoid per radiology report- From liver point of view suggest monitoring of LFT's every 4-6 months and come back to see me as needed . Please send a copy of the MRI to her pulmonologist   Pt gave permission to send MRI to Dr. Mortimer Fries @ [facility].

## 2017-03-15 NOTE — Telephone Encounter (Signed)
-----   Message from Jonathon Bellows, MD sent at 03/14/2017 11:40 AM EDT ----- Inform results are very similar to CT scan findings and suggestive of sarcoid per radiology report- From liver point of view suggest monitoring of LFT's every 4-6 months and come back to see me as needed . Please send a copy of the MRI to her pulmonologist

## 2017-03-31 ENCOUNTER — Encounter: Payer: Self-pay | Admitting: Internal Medicine

## 2017-04-02 ENCOUNTER — Other Ambulatory Visit: Payer: Self-pay | Admitting: Internal Medicine

## 2017-04-02 MED ORDER — FLUTICASONE-SALMETEROL 115-21 MCG/ACT IN AERO: 2.0000 | INHALATION_SPRAY | Freq: Two times a day (BID) | RESPIRATORY_TRACT | 6 refills | Status: DC

## 2017-05-22 DIAGNOSIS — D2239 Melanocytic nevi of other parts of face: Secondary | ICD-10-CM | POA: Diagnosis not present

## 2017-05-22 DIAGNOSIS — D225 Melanocytic nevi of trunk: Secondary | ICD-10-CM | POA: Diagnosis not present

## 2017-05-22 DIAGNOSIS — L700 Acne vulgaris: Secondary | ICD-10-CM | POA: Diagnosis not present

## 2017-05-22 DIAGNOSIS — D485 Neoplasm of uncertain behavior of skin: Secondary | ICD-10-CM | POA: Diagnosis not present

## 2017-05-22 DIAGNOSIS — Z1283 Encounter for screening for malignant neoplasm of skin: Secondary | ICD-10-CM | POA: Diagnosis not present

## 2017-05-22 DIAGNOSIS — D224 Melanocytic nevi of scalp and neck: Secondary | ICD-10-CM | POA: Diagnosis not present

## 2017-05-22 DIAGNOSIS — I781 Nevus, non-neoplastic: Secondary | ICD-10-CM | POA: Diagnosis not present

## 2017-05-22 DIAGNOSIS — D229 Melanocytic nevi, unspecified: Secondary | ICD-10-CM | POA: Diagnosis not present

## 2017-08-02 ENCOUNTER — Ambulatory Visit (INDEPENDENT_AMBULATORY_CARE_PROVIDER_SITE_OTHER): Payer: 59 | Admitting: Obstetrics and Gynecology

## 2017-08-02 ENCOUNTER — Encounter: Payer: Self-pay | Admitting: Obstetrics and Gynecology

## 2017-08-02 VITALS — BP 121/64 | HR 76 | Ht 63.0 in | Wt 140.0 lb

## 2017-08-02 DIAGNOSIS — N926 Irregular menstruation, unspecified: Secondary | ICD-10-CM | POA: Diagnosis not present

## 2017-08-02 LAB — POCT URINE PREGNANCY: Preg Test, Ur: POSITIVE — AB

## 2017-08-02 NOTE — Progress Notes (Signed)
Subjective:     Patient ID: CATHERYNE DEFORD, female   DOB: [birth year], 36 y.o.   MRN: [id number]  HPI Here for pregnancy confirmation; LMP 05/31/17 giving [facility] 03/07/18 & EGA [redacted]w[redacted]d. [id number]- previous 8 and 15 week losses and 3 term deliveries. 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   Review of Systems Negative except stated above in HPI    Objective:   Physical Exam A&Ox4 Well groomed female Blood pressure 121/64, pulse 76, height 5\' 3"  (1.6 m), weight 140 lb (63.5 kg), last menstrual period 05/31/2017. UPT+     Assessment:     Missed menses AMA    Plan:     Viability scan and nurse intake at next available appointment New OB PE with me in 2 weeks.  Hermine Feria,CNM

## 2017-08-04 ENCOUNTER — Encounter: Payer: Self-pay | Admitting: Internal Medicine

## 2017-08-06 ENCOUNTER — Other Ambulatory Visit: Payer: 59

## 2017-08-06 ENCOUNTER — Ambulatory Visit (INDEPENDENT_AMBULATORY_CARE_PROVIDER_SITE_OTHER): Payer: 59 | Admitting: Obstetrics and Gynecology

## 2017-08-06 ENCOUNTER — Encounter: Payer: Self-pay | Admitting: Obstetrics and Gynecology

## 2017-08-06 VITALS — BP 103/60 | HR 73 | Ht 63.0 in | Wt 139.7 lb

## 2017-08-06 DIAGNOSIS — Z369 Encounter for antenatal screening, unspecified: Secondary | ICD-10-CM | POA: Diagnosis not present

## 2017-08-06 DIAGNOSIS — Z1389 Encounter for screening for other disorder: Secondary | ICD-10-CM

## 2017-08-06 DIAGNOSIS — Z87898 Personal history of other specified conditions: Secondary | ICD-10-CM | POA: Diagnosis not present

## 2017-08-06 DIAGNOSIS — Z3481 Encounter for supervision of other normal pregnancy, first trimester: Secondary | ICD-10-CM

## 2017-08-06 DIAGNOSIS — Z113 Encounter for screening for infections with a predominantly sexual mode of transmission: Secondary | ICD-10-CM

## 2017-08-06 NOTE — Patient Instructions (Signed)
Pregnancy and Zika Virus Disease Zika virus disease, or Zika, is an illness that can spread to people from mosquitoes that carry the virus. It may also spread from person to person through infected body fluids. Zika first occurred in [place], but recently it has spread to new areas. The virus occurs in tropical climates. The location of Zika continues to change. Most people who become infected with Zika virus do not develop serious illness. However, Zika may cause birth defects in an unborn baby whose mother is infected with the virus. It may also increase the risk of miscarriage. What are the symptoms of Zika virus disease? In many cases, people who have been infected with Zika virus do not develop any symptoms. If symptoms appear, they usually start about a week after the person is infected. Symptoms are usually mild. They may include:  Fever.  Rash.  Red eyes.  Joint pain.  How does Zika virus disease spread? The main way that [place] virus spreads is through the bite of a certain type of mosquito. Unlike most types of mosquitos, which bite only at night, the type of mosquito that carries Zika virus bites both at night and during the day. Zika virus can also spread through sexual contact, through a blood transfusion, and from a mother to her baby before or during birth. Once you have had Zika virus disease, it is unlikely that you will get it again. Can I pass Zika to my baby during pregnancy? Yes, Zika can pass from a mother to her baby before or during birth. What problems can Zika cause for my baby? A woman who is infected with Zika virus while pregnant is at risk of having her baby born with a condition in which the brain or head is smaller than expected (microcephaly). Babies who have microcephaly can have developmental delays, seizures, hearing problems, and vision problems. Having Zika virus disease during pregnancy can also increase the risk of miscarriage. How can Zika virus disease be  prevented? There is no vaccine to prevent Zika. The best way to prevent the disease is to avoid infected mosquitoes and avoid exposure to body fluids that can spread the virus. Avoid any possible exposure to [place] by taking the following precautions. For women and their sex partners:  Avoid traveling to high-risk areas. The locations where [place] is being reported change often. To identify high-risk areas, check the CDC travel website: CreditChaos.com.ee  If you or your sex partner must travel to a high-risk area, talk with a health care provider before and after traveling.  Take all precautions to avoid mosquito bites if you live in, or travel to, any of the high-risk areas. Insect repellents are safe to use during pregnancy.  Ask your health care provider when it is safe to have sexual contact.  For women:  If you are pregnant or trying to become pregnant, avoid sexual contact with persons who may have been exposed to [place] virus, persons who have possible symptoms of Zika, or persons whose history you are unsure about. If you choose to have sexual contact with someone who may have been exposed to [place] virus, use condoms correctly during the entire duration of sexual activity, every time. Do not share sexual devices, as you may be exposed to body fluids.  Ask your health care provider about when it is safe to attempt pregnancy after a possible exposure to [place] virus.  What steps should I take to avoid mosquito bites? Take these steps to avoid mosquito bites  when you are in a high-risk area:  Wear loose clothing that covers your arms and legs.  Limit your outdoor activities.  Do not open windows unless they have window screens.  Sleep under mosquito nets.  Use insect repellent. The best insect repellents have:  DEET, picaridin, oil of lemon eucalyptus (OLE), or [id number] in them.  Higher amounts of an active ingredient in them.  Remember that insect repellents are safe to  use during pregnancy.  Do not use OLE on children who are younger than 49 years of age. Do not use insect repellent on babies who are younger than 31 months of age.  Cover your child's stroller with mosquito netting. Make sure the netting fits snugly and that any loose netting does not cover your child's mouth or nose. Do not use a blanket as a mosquito-protection cover.  Do not apply insect repellent underneath clothing.  If you are using sunscreen, apply the sunscreen before applying the insect repellent.  Treat clothing with permethrin. Do not apply permethrin directly to your skin. Follow label directions for safe use.  Get rid of standing water, where mosquitoes may reproduce. Standing water is often found in items such as buckets, bowls, animal food dishes, and flowerpots.  When you return from traveling to any high-risk area, continue taking actions to protect yourself against mosquito bites for 3 weeks, even if you show no signs of illness. This will prevent spreading Zika virus to uninfected mosquitoes. What should I know about the sexual transmission of Zika? People can spread Zika to their sexual partners during vaginal, anal, or oral sex, or by sharing sexual devices. Many people with [place] do not develop symptoms, so a person could spread the disease without knowing that they are infected. The greatest risk is to women who are pregnant or who may become pregnant. Zika virus can live longer in semen than it can live in blood. Couples can prevent sexual transmission of the virus by:  Using condoms correctly during the entire duration of sexual activity, every time. This includes vaginal, anal, and oral sex.  Not sharing sexual devices. Sharing increases your risk of being exposed to body fluid from another person.  Avoiding all sexual activity until your health care provider says it is safe.  Should I be tested for Zika virus? A sample of your blood can be tested for Zika virus. A  pregnant woman should be tested if she may have been exposed to the virus or if she has symptoms of Zika. She may also have additional tests done during her pregnancy, such ultrasound testing. Talk with your health care provider about which tests are recommended. This information is not intended to replace advice given to you by your health care provider. Make sure you discuss any questions you have with your health care provider. Document Released: 06/30/2015 Document Revised: 03/16/2016 Document Reviewed: 06/23/2015 Elsevier Interactive Patient Education  2018 [employer]. Hyperemesis Gravidarum Hyperemesis gravidarum is a severe form of nausea and vomiting that happens during pregnancy. Hyperemesis is worse than morning sickness. It may cause you to have nausea or vomiting all day for many days. It may keep you from eating and drinking enough food and liquids. Hyperemesis usually occurs during the first half (the first 20 weeks) of pregnancy. It often goes away once a woman is in her second half of pregnancy. However, sometimes hyperemesis continues through an entire pregnancy. What are the causes? The cause of this condition is not known. It may be related  to changes in chemicals (hormones) in the body during pregnancy, such as the high level of pregnancy hormone (human chorionic gonadotropin) or the increase in the female sex hormone (estrogen). What are the signs or symptoms? Symptoms of this condition include:  Severe nausea and vomiting.  Nausea that does not go away.  Vomiting that does not allow you to keep any food down.  Weight loss.  Body fluid loss (dehydration).  Having no desire to eat, or not liking food that you have previously enjoyed.  How is this diagnosed? This condition may be diagnosed based on:  A physical exam.  Your medical history.  Your symptoms.  Blood tests.  Urine tests.  How is this treated? This condition may be managed with medicine. If  medicines to do not help relieve nausea and vomiting, you may need to receive fluids through an IV tube at the hospital. Follow these instructions at home:  Take over-the-counter and prescription medicines only as told by your health care provider.  Avoid iron pills and multivitamins that contain iron for the first 3-4 months of pregnancy. If you take prescription iron pills, do not stop taking them unless your health care provider approves.  Take the following actions to help prevent nausea and vomiting: ? In the morning, before getting out of bed, try eating a couple of dry crackers or a piece of toast. ? Avoid foods and smells that upset your stomach. Fatty and spicy foods may make nausea worse. ? Eat 5-6 small meals a day. ? Do not drink fluids while eating meals. Drink between meals. ? Eat or suck on things that have ginger in them. Ginger can help relieve nausea. ? Avoid food preparation. The smell of food can spoil your appetite or trigger nausea.  Follow instructions from your health care provider about eating or drinking restrictions.  For snacks, eat high-protein foods, such as cheese.  Keep all follow-up and pre-birth (prenatal) visits as told by your health care provider. This is important. Contact a health care provider if:  You have pain in your abdomen.  You have a severe headache.  You have vision problems.  You are losing weight. Get help right away if:  You cannot drink fluids without vomiting.  You vomit blood.  You have constant nausea and vomiting.  You are very weak.  You are very thirsty.  You feel dizzy.  You faint.  You have a fever or other symptoms that last for more than 2-3 days.  You have a fever and your symptoms suddenly get worse. Summary  Hyperemesis gravidarum is a severe form of nausea and vomiting that happens during pregnancy.  Making some changes to your eating habits may help relieve nausea and vomiting.  This condition may  be managed with medicine.  If medicines to do not help relieve nausea and vomiting, you may need to receive fluids through an IV tube at the hospital. This information is not intended to replace advice given to you by your health care provider. Make sure you discuss any questions you have with your health care provider. Document Released: 10/09/2005 Document Revised: 06/07/2016 Document Reviewed: 06/07/2016 Elsevier Interactive Patient Education  2017 [place] of Pregnancy The first trimester of pregnancy is from week 1 until the end of week 13 (months 1 through 3). During this time, your baby will begin to develop inside you. At 6-8 weeks, the eyes and face are formed, and the heartbeat can be seen on ultrasound. At the  end of 12 weeks, all the baby's organs are formed. Prenatal care is all the medical care you receive before the birth of your baby. Make sure you get good prenatal care and follow all of your doctor's instructions. Follow these instructions at home: Medicines  Take over-the-counter and prescription medicines only as told by your doctor. Some medicines are safe and some medicines are not safe during pregnancy.  Take a prenatal vitamin that contains at least 600 micrograms (mcg) of folic acid.  If you have trouble pooping (constipation), take medicine that will make your stool soft (stool softener) if your doctor approves. Eating and drinking  Eat regular, healthy meals.  Your doctor will tell you the amount of weight gain that is right for you.  Avoid raw meat and uncooked cheese.  If you feel sick to your stomach (nauseous) or throw up (vomit): ? Eat 4 or 5 small meals a day instead of 3 large meals. ? Try eating a few soda crackers. ? Drink liquids between meals instead of during meals.  To prevent constipation: ? Eat foods that are high in fiber, like fresh fruits and vegetables, whole grains, and beans. ? Drink enough fluids to keep your pee  (urine) clear or pale yellow. Activity  Exercise only as told by your doctor. Stop exercising if you have cramps or pain in your lower belly (abdomen) or low back.  Do not exercise if it is too hot, too humid, or if you are in a place of great height (high altitude).  Try to avoid standing for long periods of time. Move your legs often if you must stand in one place for a long time.  Avoid heavy lifting.  Wear low-heeled shoes. Sit and stand up straight.  You can have sex unless your doctor tells you not to. Relieving pain and discomfort  Wear a good support bra if your breasts are sore.  Take warm water baths (sitz baths) to soothe pain or discomfort caused by hemorrhoids. Use hemorrhoid cream if your doctor says it is okay.  Rest with your legs raised if you have leg cramps or low back pain.  If you have puffy, bulging veins (varicose veins) in your legs: ? Wear support hose or compression stockings as told by your doctor. ? Raise (elevate) your feet for 15 minutes, 3-4 times a day. ? Limit salt in your food. Prenatal care  Schedule your prenatal visits by the twelfth week of pregnancy.  Write down your questions. Take them to your prenatal visits.  Keep all your prenatal visits as told by your doctor. This is important. Safety  Wear your seat belt at all times when driving.  Make a list of emergency phone numbers. The list should include numbers for family, friends, the hospital, and police and fire departments. General instructions  Ask your doctor for a referral to a local prenatal class. Begin classes no later than at the start of month 6 of your pregnancy.  Ask for help if you need counseling or if you need help with nutrition. Your doctor can give you advice or tell you where to go for help.  Do not use hot tubs, steam rooms, or saunas.  Do not douche or use tampons or scented sanitary pads.  Do not cross your legs for long periods of time.  Avoid all herbs  and alcohol. Avoid drugs that are not approved by your doctor.  Do not use any tobacco products, including cigarettes, chewing tobacco, and electronic cigarettes.  If you need help quitting, ask your doctor. You may get counseling or other support to help you quit.  Avoid cat litter boxes and soil used by cats. These carry germs that can cause birth defects in the baby and can cause a loss of your baby (miscarriage) or stillbirth.  Visit your dentist. At home, brush your teeth with a soft toothbrush. Be gentle when you floss. Contact a doctor if:  You are dizzy.  You have mild cramps or pressure in your lower belly.  You have a nagging pain in your belly area.  You continue to feel sick to your stomach, you throw up, or you have watery poop (diarrhea).  You have a bad smelling fluid coming from your vagina.  You have pain when you pee (urinate).  You have increased puffiness (swelling) in your face, hands, legs, or ankles. Get help right away if:  You have a fever.  You are leaking fluid from your vagina.  You have spotting or bleeding from your vagina.  You have very bad belly cramping or pain.  You gain or lose weight rapidly.  You throw up blood. It may look like coffee grounds.  You are around people who have [place] measles, fifth disease, or chickenpox.  You have a very bad headache.  You have shortness of breath.  You have any kind of trauma, such as from a fall or a car accident. Summary  The first trimester of pregnancy is from week 1 until the end of week 13 (months 1 through 3).  To take care of yourself and your unborn baby, you will need to eat healthy meals, take medicines only if your doctor tells you to do so, and do activities that are safe for you and your baby.  Keep all follow-up visits as told by your doctor. This is important as your doctor will have to ensure that your baby is healthy and growing well. This information is not intended to replace  advice given to you by your health care provider. Make sure you discuss any questions you have with your health care provider. Document Released: 03/27/2008 Document Revised: 10/17/2016 Document Reviewed: 10/17/2016 Elsevier Interactive Patient Education  2017 [place]. Commonly Asked Questions During Pregnancy  Cats: A parasite can be excreted in cat feces.  To avoid exposure you need to have another person empty the little box.  If you must empty the litter box you will need to wear gloves.  Wash your hands after handling your cat.  This parasite can also be found in raw or undercooked meat so this should also be avoided.  Colds, Sore Throats, Flu: Please check your medication sheet to see what you can take for symptoms.  If your symptoms are unrelieved by these medications please call the office.  Dental Work: Most any dental work [employer] banker[employer] recommends is permitted.  X-rays should only be taken during the first trimester if absolutely necessary.  Your abdomen should be shielded with a lead apron during all x-rays.  Please notify your provider prior to receiving any x-rays.  Novocaine is fine; gas is not recommended.  If your dentist requires a note from [place] prior to dental work please call the office and we will provide one for you.  Exercise: Exercise is an important part of staying healthy during your pregnancy.  You may continue most exercises you were accustomed to prior to pregnancy.  Later in your pregnancy you will most likely notice you have difficulty with activities  requiring balance like riding a bicycle.  It is important that you listen to your body and avoid activities that put you at a higher risk of falling.  Adequate rest and staying well hydrated are a must!  If you have questions about the safety of specific activities ask your provider.    Exposure to Children with illness: Try to avoid obvious exposure; report any symptoms to [place] when noted,  If you have chicken pos, red  measles or mumps, you should be immune to these diseases.   Please do not take any vaccines while pregnant unless you have checked with your OB provider.  Fetal Movement: After 28 weeks we recommend you do "kick counts" twice daily.  Lie or sit down in a calm quiet environment and count your baby movements "kicks".  You should feel your baby at least 10 times per hour.  If you have not felt 10 kicks within the first hour get up, walk around and have something sweet to eat or drink then repeat for an additional hour.  If count remains less than 10 per hour notify your provider.  Fumigating: Follow your pest control agent's advice as to how long to stay out of your home.  Ventilate the area well before re-entering.  Hemorrhoids:   Most over-the-counter preparations can be used during pregnancy.  Check your medication to see what is safe to use.  It is important to use a stool softener or fiber in your diet and to drink lots of liquids.  If hemorrhoids seem to be getting worse please call the office.   Hot Tubs:  Hot tubs Jacuzzis and saunas are not recommended while pregnant.  These increase your internal body temperature and should be avoided.  Intercourse:  Sexual intercourse is safe during pregnancy as long as you are comfortable, unless otherwise advised by your provider.  Spotting may occur after intercourse; report any bright red bleeding that is heavier than spotting.  Labor:  If you know that you are in labor, please go to the hospital.  If you are unsure, please call the office and let us help you decide what to do.  Lifting, straining, etc:  If your job requires heavy lifting or straining please check with your provider for any limitations.  Generally, you should not lift items heavier than that you can lift simply with your hands and arms (no back muscles)  Painting:  Paint fumes do not harm your pregnancy, but may make you ill and should be avoided if possible.  Latex or water based paints  have less odor than oils.  Use adequate ventilation while painting.  Permanents & Hair Color:  Chemicals in hair dyes are not recommended as they cause increase hair dryness which can increase hair loss during pregnancy.  " Highlighting" and permanents are allowed.  Dye may be absorbed differently and permanents may not hold as well during pregnancy.  Sunbathing:  Use a sunscreen, as skin burns easily during pregnancy.  Drink plenty of fluids; avoid over heating.  Tanning Beds:  Because their possible side effects are still unknown, tanning beds are not recommended.  Ultrasound Scans:  Routine ultrasounds are performed at approximately 20 weeks.  You will be able to see your baby's general anatomy an if you would like to know the gender this can usually be determined as well.  If it is questionable when you conceived you may also receive an ultrasound early in your pregnancy for dating purposes.  Otherwise ultrasound exams  are not routinely performed unless there is a medical necessity.  Although you can request a scan we ask that you pay for it when conducted because insurance does not cover " patient request" scans.  Work: If your pregnancy proceeds without complications you may work until your due date, unless your physician or employer advises otherwise.  Round Ligament Pain/Pelvic Discomfort:  Sharp, shooting pains not associated with bleeding are fairly common, usually occurring in the second trimester of pregnancy.  They tend to be worse when standing up or when you remain standing for long periods of time.  These are the result of pressure of certain pelvic ligaments called "round ligaments".  Rest, Tylenol and heat seem to be the most effective relief.  As the womb and fetus grow, they rise out of the pelvis and the discomfort improves.  Please notify the office if your pain seems different than that described.  It may represent a more serious condition.   

## 2017-08-06 NOTE — Progress Notes (Signed)
Angela Bush presents for NOB nurse interview visit. Pregnancy confirmation done on 08/02/2017, upt-positive, seen by [facility]. LMP: 05/31/2017.  G-6.  [id number]. Pregnancy education material explained and given. No cats in the home. NOB labs ordered.  HIV labs and Drug screen were explained optional and she did not decline. Drug screen ordered. PNV encouraged. Genetic screening options discussed. Genetic testing: Ordered.  Pt desired the [id number] for genetic testing and was done today. 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 No Down's syndrome. Pt may discuss with provider. Pt. To follow up with provider on 08/15/2017 weeks for NOB physical and dating and viability scan.   All questions answered.

## 2017-08-07 LAB — CBC WITH DIFFERENTIAL/PLATELET
BASOS ABS: 0 10*3/uL (ref 0.0–0.2)
Basos: 0 %
EOS (ABSOLUTE): 0.1 10*3/uL (ref 0.0–0.4)
EOS: 1 %
HEMATOCRIT: 41 % (ref 34.0–46.6)
HEMOGLOBIN: 13.7 g/dL (ref 11.1–15.9)
IMMATURE GRANS (ABS): 0 10*3/uL (ref 0.0–0.1)
Immature Granulocytes: 0 %
LYMPHS ABS: 1.5 10*3/uL (ref 0.7–3.1)
LYMPHS: 17 %
MCH: 31.9 pg (ref 26.6–33.0)
MCHC: 33.4 g/dL (ref 31.5–35.7)
MCV: 96 fL (ref 79–97)
MONOCYTES: 7 %
Monocytes Absolute: 0.6 10*3/uL (ref 0.1–0.9)
NEUTROS PCT: 75 %
Neutrophils Absolute: 6.2 10*3/uL (ref 1.4–7.0)
Platelets: 252 10*3/uL (ref 150–379)
RBC: 4.29 x10E6/uL (ref 3.77–5.28)
RDW: 13.2 % (ref 12.3–15.4)
WBC: 8.3 10*3/uL (ref 3.4–10.8)

## 2017-08-07 LAB — VARICELLA ZOSTER ANTIBODY, IGG: Varicella zoster IgG: 1059 index (ref >165)

## 2017-08-07 LAB — ABO

## 2017-08-07 LAB — RPR: RPR: NONREACTIVE

## 2017-08-07 LAB — RH TYPE: Rh Factor: POSITIVE

## 2017-08-07 LAB — HEPATITIS B SURFACE ANTIGEN: HEP B S AG: NEGATIVE

## 2017-08-07 LAB — HIV ANTIBODY (ROUTINE TESTING W REFLEX): HIV SCREEN 4TH GENERATION: NONREACTIVE

## 2017-08-07 LAB — RUBELLA SCREEN: Rubella Antibodies, IGG: 2.37 index (ref >0.99)

## 2017-08-07 LAB — GC/CHLAMYDIA PROBE AMP
CHLAMYDIA, DNA PROBE: NEGATIVE
NEISSERIA GONORRHOEAE BY PCR: NEGATIVE

## 2017-08-07 LAB — ANTIBODY SCREEN: ANTIBODY SCREEN: NEGATIVE

## 2017-08-08 LAB — URINALYSIS, ROUTINE W REFLEX MICROSCOPIC
BILIRUBIN UA: NEGATIVE
GLUCOSE, UA: NEGATIVE
Ketones, UA: NEGATIVE
LEUKOCYTES UA: NEGATIVE
Nitrite, UA: NEGATIVE
PH UA: 7 (ref 5.0–7.5)
PROTEIN UA: NEGATIVE
RBC, UA: NEGATIVE
UUROB: 0.2 mg/dL (ref 0.2–1.0)

## 2017-08-08 LAB — MONITOR DRUG PROFILE 14(MW)
Amphetamine Scrn, Ur: NEGATIVE ng/mL
BARBITURATE SCREEN URINE: NEGATIVE ng/mL
BENZODIAZEPINE SCREEN, URINE: NEGATIVE ng/mL
BUPRENORPHINE, URINE: NEGATIVE ng/mL
CANNABINOIDS UR QL SCN: NEGATIVE ng/mL
COCAINE(METAB.)SCREEN, URINE: NEGATIVE ng/mL
Creatinine(Crt), U: 16.8 mg/dL — ABNORMAL LOW (ref 20.0–300.0)
FENTANYL, URINE: NEGATIVE pg/mL
MEPERIDINE SCREEN, URINE: NEGATIVE ng/mL
METHADONE SCREEN, URINE: NEGATIVE ng/mL
OPIATE SCREEN URINE: NEGATIVE ng/mL
OXYCODONE+OXYMORPHONE UR QL SCN: NEGATIVE ng/mL
PHENCYCLIDINE QUANTITATIVE URINE: NEGATIVE ng/mL
Ph of Urine: 6.5 (ref 4.5–8.9)
Propoxyphene Scrn, Ur: NEGATIVE ng/mL
SPECIFIC GRAVITY: 1.0029
TRAMADOL SCREEN, URINE: NEGATIVE ng/mL

## 2017-08-08 LAB — NICOTINE SCREEN, URINE: COTININE UR QL SCN: NEGATIVE ng/mL

## 2017-08-08 LAB — CULTURE, OB URINE

## 2017-08-08 LAB — URINE CULTURE, OB REFLEX: Organism ID, Bacteria: NO GROWTH

## 2017-08-11 LAB — MATERNIT 21 PLUS CORE, BLOOD
Chromosome 13: NEGATIVE
Chromosome 18: NEGATIVE
Chromosome 21: NEGATIVE
Y Chromosome: DETECTED

## 2017-08-14 ENCOUNTER — Other Ambulatory Visit: Payer: Self-pay | Admitting: Obstetrics and Gynecology

## 2017-08-15 ENCOUNTER — Ambulatory Visit: Payer: 59

## 2017-08-15 ENCOUNTER — Encounter: Payer: Self-pay | Admitting: Obstetrics and Gynecology

## 2017-08-15 ENCOUNTER — Other Ambulatory Visit: Payer: Self-pay | Admitting: Obstetrics and Gynecology

## 2017-08-15 ENCOUNTER — Ambulatory Visit (INDEPENDENT_AMBULATORY_CARE_PROVIDER_SITE_OTHER): Payer: 59 | Admitting: Obstetrics and Gynecology

## 2017-08-15 VITALS — BP 106/65 | HR 69 | Wt 141.0 lb

## 2017-08-15 DIAGNOSIS — N926 Irregular menstruation, unspecified: Secondary | ICD-10-CM

## 2017-08-15 DIAGNOSIS — Z3491 Encounter for supervision of normal pregnancy, unspecified, first trimester: Secondary | ICD-10-CM

## 2017-08-15 LAB — POCT URINALYSIS DIPSTICK
BILIRUBIN UA: NEGATIVE
GLUCOSE UA: NEGATIVE
KETONES UA: NEGATIVE
Leukocytes, UA: NEGATIVE
Nitrite, UA: NEGATIVE
PH UA: 7 (ref 5.0–8.0)
Protein, UA: NEGATIVE
RBC UA: NEGATIVE
Spec Grav, UA: 1.01 (ref 1.010–1.025)
Urobilinogen, UA: 0.2 E.U./dL

## 2017-08-15 NOTE — Patient Instructions (Signed)

## 2017-08-15 NOTE — Progress Notes (Signed)
NOB physical- pt is doing well, slightly fatigued

## 2017-08-15 NOTE — Progress Notes (Signed)
NEW OB HISTORY AND PHYSICAL  SUBJECTIVE:       Angela Bush is a 36 y.o. [phone] female, Patient's last menstrual period was 05/31/2017., Estimated Date of Delivery: 03/07/18, [redacted]w[redacted]d, presents today for establishment of Prenatal Care. She has no unusual complaints and complains of headache      Gynecologic History Patient's last menstrual period was 05/31/2017. Normal Contraception: none Last Pap: 2014. Results were: normal  Obstetric History OB History  Gravida Para Term Preterm AB Living  6 2 1 1 1 3   SAB TAB Ectopic Multiple Live Births  1 0 0 0 3    # Outcome Date GA Lbr Len/2nd Weight Sex Delivery Anes PTL Lv  6 Current           5 Term 06/21/15 [redacted]w[redacted]d 06:43 / 00:10 7 lb 15 oz (3.6 kg) F Vag-Spont None  LIV  4 Gravida 04/24/11    F Vag-Spont   LIV  3 Gravida 08/24/07    M Vag-Spont   LIV  2 Preterm 2007     Vag-Spont   FD  1 SAB         ND      Past Medical History:  Diagnosis Date  . Abnormal fetal ultrasound   . Hx of hypercholesterolemia   . Pneumonia   . SVT (supraventricular tachycardia) (HCC)     Past Surgical History:  Procedure Laterality Date  . BACK SURGERY     x4  . DILATION AND CURETTAGE OF UTERUS  9 2015   x2  . ELECTROMAGNETIC NAVIGATION BROCHOSCOPY N/A 01/17/2017   Procedure: ELECTROMAGNETIC NAVIGATION BRONCHOSCOPY;  Surgeon: [name], MD;  Location: [facility] ORS;  Service: Cardiopulmonary;  Laterality: N/A;  . LASIK    . TONSILLECTOMY      Current Outpatient Prescriptions on File Prior to Visit  Medication Sig Dispense Refill  . Prenatal Vit-Fe Fumarate-FA (MULTIVITAMIN-PRENATAL) 27-0.8 MG TABS tablet Take 1 tablet by mouth daily at 12 noon.    Marland Kitchen albuterol (PROVENTIL HFA;VENTOLIN HFA) 108 (90 Base) MCG/ACT inhaler Inhale 2 puffs into the lungs every 6 (six) hours as needed for wheezing or shortness of breath. (Patient not taking: Reported on 02/22/2017) 1 Inhaler 0  . fluticasone-salmeterol (ADVAIR HFA) 115-21 MCG/ACT inhaler Inhale 2 puffs  into the lungs 2 (two) times daily. (Patient not taking: Reported on 08/02/2017) 1 Inhaler 6   Current Facility-Administered Medications on File Prior to Visit  Medication Dose Route Frequency Provider Last Rate Last Dose  . ipratropium-albuterol (DUONEB) 0.5-2.5 (3) MG/3ML nebulizer solution 3 mL  3 mL Nebulization Once Caryn Section Linden Dolin, PA-C        Allergies  Allergen Reactions  . Latex Itching, Swelling and Other (See Comments)    Redness     Social History   Social History  . Marital status: Married    Spouse name: N/A  . Number of children: N/A  . Years of education: N/A   Occupational History  . Not on file.   Social History Main Topics  . Smoking status: Never Smoker  . Smokeless tobacco: Never Used  . Alcohol use Yes     Comment: rare  . Drug use: No  . Sexual activity: Yes    Birth control/ protection: Condom   Other Topics Concern  . Not on file   Social History Narrative  . No narrative on file    Family History  Problem Relation Age of Onset  . Hypertension Father   . Hyperlipidemia Father  The following portions of the patient's history were reviewed and updated as appropriate: allergies, current medications, past OB history, past medical history, past surgical history, past family history, past social history, and problem list.    OBJECTIVE: Initial Physical Exam (New OB)  GENERAL APPEARANCE: alert, well appearing, in no apparent distress, oriented to person, place and time HEAD: normocephalic, atraumatic MOUTH: mucous membranes moist, pharynx normal without lesions and dental hygiene good THYROID: no thyromegaly or masses present BREASTS: not examined LUNGS: not examined HEART: not examined ABDOMEN: soft, nontender, nondistended, no abnormal masses, no epigastric pain, fundus not palpable and FHT present EXTREMITIES: not examined SKIN: normal coloration and turgor, no rashes LYMPH NODES: no adenopathy palpable NEUROLOGIC: alert,  oriented, normal speech, no focal findings or movement disorder noted  PELVIC EXAM not indicated  ASSESSMENT: Normal pregnancy AMA   PLAN: Prenatal care See orders

## 2017-09-10 ENCOUNTER — Ambulatory Visit (INDEPENDENT_AMBULATORY_CARE_PROVIDER_SITE_OTHER): Payer: 59 | Admitting: Certified Nurse Midwife

## 2017-09-10 ENCOUNTER — Encounter: Payer: Self-pay | Admitting: Certified Nurse Midwife

## 2017-09-10 VITALS — BP 101/60 | HR 77 | Wt 143.5 lb

## 2017-09-10 DIAGNOSIS — Z3482 Encounter for supervision of other normal pregnancy, second trimester: Secondary | ICD-10-CM

## 2017-09-10 LAB — POCT URINALYSIS DIPSTICK
Bilirubin, UA: NEGATIVE
Blood, UA: NEGATIVE
GLUCOSE UA: NEGATIVE
Ketones, UA: NEGATIVE
LEUKOCYTES UA: NEGATIVE
Nitrite, UA: NEGATIVE
Protein, UA: NEGATIVE
SPEC GRAV UA: 1.01 (ref 1.010–1.025)
UROBILINOGEN UA: 0.2 U/dL
pH, UA: 7.5 (ref 5.0–8.0)

## 2017-09-10 NOTE — Patient Instructions (Signed)
Round Ligament Pain The round ligament is a cord of muscle and tissue that helps to support the uterus. It can become a source of pain during pregnancy if it becomes stretched or twisted as the baby grows. The pain usually begins in the second trimester of pregnancy, and it can come and go until the baby is delivered. It is not a serious problem, and it does not cause harm to the baby. Round ligament pain is usually a short, sharp, and pinching pain, but it can also be a dull, lingering, and aching pain. The pain is felt in the lower side of the abdomen or in the groin. It usually starts deep in the groin and moves up to the outside of the hip area. Pain can occur with:  A sudden change in position.  Rolling over in bed.  Coughing or sneezing.  Physical activity.  Follow these instructions at home: Watch your condition for any changes. Take these steps to help with your pain:  When the pain starts, relax. Then try: ? Sitting down. ? Flexing your knees up to your abdomen. ? Lying on your side with one pillow under your abdomen and another pillow between your legs. ? Sitting in a warm bath for 15-20 minutes or until the pain goes away.  Take over-the-counter and prescription medicines only as told by your health care provider.  Move slowly when you sit and stand.  Avoid long walks if they cause pain.  Stop or lessen your physical activities if they cause pain.  Contact a health care provider if:  Your pain does not go away with treatment.  You feel pain in your back that you did not have before.  Your medicine is not helping. Get help right away if:  You develop a fever or chills.  You develop uterine contractions.  You develop vaginal bleeding.  You develop nausea or vomiting.  You develop diarrhea.  You have pain when you urinate. This information is not intended to replace advice given to you by your health care provider. Make sure you discuss any questions you have  with your health care provider. Document Released: 07/18/2008 Document Revised: 03/16/2016 Document Reviewed: 12/16/2014 Elsevier Interactive Patient Education  2018 [employer]. Common Medications Safe in Pregnancy  Acne:      Constipation:  Benzoyl Peroxide     Colace  Clindamycin      Dulcolax Suppository  Topica Erythromycin     Fibercon  Salicylic Acid      Metamucil         Miralax AVOID:        Senakot   Accutane    Cough:  Retin-A       Cough Drops  Tetracycline      Phenergan w/ Codeine if Rx  Minocycline      Robitussin (Plain & DM)  Antibiotics:     Crabs/Lice:  Ceclor       RID  Cephalosporins    AVOID:  E-Mycins      Kwell  Keflex  Macrobid/Macrodantin   Diarrhea:  Penicillin      Kao-Pectate  Zithromax      Imodium AD         PUSH FLUIDS AVOID:       Cipro     Fever:  Tetracycline      Tylenol (Regular or Extra  Minocycline       Strength)  Levaquin      Extra Strength-Do not  Exceed 8 tabs/24 hrs Caffeine:        <253m/day (equiv. To 1 cup of coffee or  approx. 3 12 oz sodas)         Gas: Cold/Hayfever:       Gas-X  Benadryl      Mylicon  Claritin       Phazyme  **Claritin-D        Chlor-Trimeton    Headaches:  Dimetapp      ASA-Free Excedrin  Drixoral-Non-Drowsy     Cold Compress  Mucinex (Guaifenasin)     Tylenol (Regular or Extra  Sudafed/Sudafed-12 Hour     Strength)  **Sudafed PE Pseudoephedrine   Tylenol Cold & Sinus     Vicks Vapor Rub  Zyrtec  **AVOID if Problems With Blood Pressure         Heartburn: Avoid lying down for at least 1 hour after meals  Aciphex      Maalox     Rash:  Milk of Magnesia     Benadryl    Mylanta       1% Hydrocortisone Cream  Pepcid  Pepcid Complete   Sleep Aids:  Prevacid      Ambien   Prilosec       Benadryl  Rolaids       Chamomile Tea  Tums (Limit 4/day)     Unisom  Zantac       Tylenol PM         Warm milk-add vanilla or  Hemorrhoids:       Sugar for taste  Anusol/Anusol  H.C.  (RX: Analapram 2.5%)  Sugar Substitutes:  Hydrocortisone OTC     Ok in moderation  Preparation H      Tucks        Vaseline lotion applied to tissue with wiping    Herpes:     Throat:  Acyclovir      Oragel  Famvir  Valtrex     Vaccines:         Flu Shot Leg Cramps:       *Gardasil  Benadryl      Hepatitis A         Hepatitis B Nasal Spray:       Pneumovax  Saline Nasal Spray     Polio Booster         Tetanus Nausea:       Tuberculosis test or PPD  Vitamin B6 25 mg TID   AVOID:    Dramamine      *Gardasil  Emetrol       Live Poliovirus  Ginger Root 250 mg QID    MMR (measles, mumps &  High Complex Carbs @ Bedtime    rebella)  Sea Bands-Accupressure    Varicella (Chickenpox)  Unisom 1/2 tab TID     *No known complications           If received before Pain:         Known pregnancy;   Darvocet       Resume series after  Lortab        Delivery  Percocet    Yeast:   Tramadol      Femstat  Tylenol 3      Gyne-lotrimin  Ultram       Monistat  Vicodin           MISC:         All Sunscreens  Hair Coloring/highlights          Insect Repellant's          (Including DEET)         Mystic Tans Second Trimester of Pregnancy The second trimester is from week 13 through week 28, month 4 through 6. This is often the time in pregnancy that you feel your best. Often times, morning sickness has lessened or quit. You may have more energy, and you may get hungry more often. Your unborn baby (fetus) is growing rapidly. At the end of the sixth month, he or she is about 9 inches long and weighs about 1 pounds. You will likely feel the baby move (quickening) between 18 and 20 weeks of pregnancy. Follow these instructions at home:  Avoid all smoking, herbs, and alcohol. Avoid drugs not approved by your doctor.  Do not use any tobacco products, including cigarettes, chewing tobacco, and electronic cigarettes. If you need help quitting, ask your doctor. You may get counseling or  other support to help you quit.  Only take medicine as told by your doctor. Some medicines are safe and some are not during pregnancy.  Exercise only as told by your doctor. Stop exercising if you start having cramps.  Eat regular, healthy meals.  Wear a good support bra if your breasts are tender.  Do not use hot tubs, steam rooms, or saunas.  Wear your seat belt when driving.  Avoid raw meat, uncooked cheese, and liter boxes and soil used by cats.  Take your prenatal vitamins.  Take 1500-2000 milligrams of calcium daily starting at the 20th week of pregnancy until you deliver your baby.  Try taking medicine that helps you poop (stool softener) as needed, and if your doctor approves. Eat more fiber by eating fresh fruit, vegetables, and whole grains. Drink enough fluids to keep your pee (urine) clear or pale yellow.  Take warm water baths (sitz baths) to soothe pain or discomfort caused by hemorrhoids. Use hemorrhoid cream if your doctor approves.  If you have puffy, bulging veins (varicose veins), wear support hose. Raise (elevate) your feet for 15 minutes, 3-4 times a day. Limit salt in your diet.  Avoid heavy lifting, wear low heals, and sit up straight.  Rest with your legs raised if you have leg cramps or low back pain.  Visit your dentist if you have not gone during your pregnancy. Use a soft toothbrush to brush your teeth. Be gentle when you floss.  You can have sex (intercourse) unless your doctor tells you not to.  Go to your doctor visits. Get help if:  You feel dizzy.  You have mild cramps or pressure in your lower belly (abdomen).  You have a nagging pain in your belly area.  You continue to feel sick to your stomach (nauseous), throw up (vomit), or have watery poop (diarrhea).  You have bad smelling fluid coming from your vagina.  You have pain with peeing (urination). Get help right away if:  You have a fever.  You are leaking fluid from your  vagina.  You have spotting or bleeding from your vagina.  You have severe belly cramping or pain.  You lose or gain weight rapidly.  You have trouble catching your breath and have chest pain.  You notice sudden or extreme puffiness (swelling) of your face, hands, ankles, feet, or legs.  You have not felt the baby move in over an hour.  You have severe headaches that do not go away  with medicine.  You have vision changes. This information is not intended to replace advice given to you by your health care provider. Make sure you discuss any questions you have with your health care provider. Document Released: 01/03/2010 Document Revised: 03/16/2016 Document Reviewed: 12/10/2012 Elsevier Interactive Patient Education  2017 [employer].

## 2017-09-10 NOTE — Progress Notes (Signed)
ROB-Pt doing well, reports nausea and constipation. Home treatment measures discussed including use of OTC medications. Encouraged use of abdominal support, handout given.  Questions practice policies regarding "meeting all the providers" and midwifery vs MD care. Would like to return to speak with Melody next visit and discuss. Reviewed red flag symptoms and when to call. RTC x 3-4 weeks for ROB with Melody or sooner if needed.

## 2017-09-10 NOTE — Progress Notes (Signed)
ROB- no complaints.

## 2017-09-11 ENCOUNTER — Other Ambulatory Visit: Payer: Self-pay | Admitting: Certified Nurse Midwife

## 2017-10-02 ENCOUNTER — Encounter: Payer: 59 | Admitting: Obstetrics and Gynecology

## 2017-10-02 ENCOUNTER — Ambulatory Visit (INDEPENDENT_AMBULATORY_CARE_PROVIDER_SITE_OTHER): Payer: 59 | Admitting: Obstetrics and Gynecology

## 2017-10-02 VITALS — BP 114/62 | HR 80 | Wt 146.8 lb

## 2017-10-02 DIAGNOSIS — Z3492 Encounter for supervision of normal pregnancy, unspecified, second trimester: Secondary | ICD-10-CM

## 2017-10-02 LAB — POCT URINALYSIS DIPSTICK
BILIRUBIN UA: NEGATIVE
GLUCOSE UA: NEGATIVE
Ketones, UA: NEGATIVE
Leukocytes, UA: NEGATIVE
Nitrite, UA: NEGATIVE
Protein, UA: NEGATIVE
RBC UA: NEGATIVE
Spec Grav, UA: 1.01 (ref 1.010–1.025)
Urobilinogen, UA: 0.2 E.U./dL
pH, UA: 7 (ref 5.0–8.0)

## 2017-10-02 NOTE — Progress Notes (Signed)
ROB- doing well, SOB at times when active, varicose veins flaring up- wearing compression, anatomy scan next visit.

## 2017-10-02 NOTE — Progress Notes (Signed)
ROB- pt is doing well 

## 2017-10-08 ENCOUNTER — Other Ambulatory Visit: Payer: Self-pay | Admitting: Obstetrics and Gynecology

## 2017-10-08 DIAGNOSIS — Z369 Encounter for antenatal screening, unspecified: Secondary | ICD-10-CM

## 2017-10-18 ENCOUNTER — Ambulatory Visit (INDEPENDENT_AMBULATORY_CARE_PROVIDER_SITE_OTHER): Payer: 59

## 2017-10-18 DIAGNOSIS — Z369 Encounter for antenatal screening, unspecified: Secondary | ICD-10-CM

## 2017-10-23 NOTE — L&D Delivery Note (Signed)
      Delivery Note   Angela Bush is a 37 y.o. [id number] at [redacted]w[redacted]d Estimated Date of Delivery: 03/07/18  PRE-OPERATIVE DIAGNOSIS:  1) [redacted]w[redacted]d pregnancy.   POST-OPERATIVE DIAGNOSIS:  1) [redacted]w[redacted]d pregnancy s/p Vaginal, Spontaneous   Delivery Type: Vaginal, Spontaneous    Delivery Anesthesia: Other Nitrous Oxide  Labor Complications:    Compound presentation right hand     ESTIMATED BLOOD LOSS: 350 ml    FINDINGS:   1) female infant, Apgar scores of 8    at 1 minute and 9    at 5 minutes and a birthweight of 119  ounces.    2) Nuchal cord: yes, loose delivered through it  SPECIMENS:   PLACENTA:   Appearance:   Intact, 3 vessel cord. Cord blood sample collected   Removal: Spontaneous   Spontaneous   Disposition:   Held per protocol then discarded  DISPOSITION:  Infant to left in stable condition in the delivery room, with L&D personnel and mother,  NARRATIVE SUMMARY: Labor course:  Ms. Angela Bush is a [id number] at [redacted]w[redacted]d who presented for induction of labor.  She progressed well in labor without pitocin. She had 2 doses of Cytotec and AROM. She received the appropriate anesthesia ( Nitrous Oxide) and proceeded to complete dilation. She evidenced good maternal expulsive effort during the second stage. She went on to deliver a viable female infant " Angela Bush". The placenta delivered without problems and was noted to be complete. A perineal and vaginal examination was performed. Her  Perineum was intact.  The patient tolerated this well.  Philip Aspen, CNM  03/01/2018 5:17 PM

## 2017-10-30 ENCOUNTER — Ambulatory Visit (INDEPENDENT_AMBULATORY_CARE_PROVIDER_SITE_OTHER): Payer: 59 | Admitting: Certified Nurse Midwife

## 2017-10-30 VITALS — BP 118/62 | HR 90 | Wt 153.3 lb

## 2017-10-30 DIAGNOSIS — Z3492 Encounter for supervision of normal pregnancy, unspecified, second trimester: Secondary | ICD-10-CM

## 2017-10-30 LAB — POCT URINALYSIS DIPSTICK
Bilirubin, UA: NEGATIVE
Blood, UA: NEGATIVE
Glucose, UA: NEGATIVE
KETONES UA: NEGATIVE
LEUKOCYTES UA: NEGATIVE
NITRITE UA: NEGATIVE
PH UA: 7 (ref 5.0–8.0)
PROTEIN UA: NEGATIVE
Spec Grav, UA: 1.01 (ref 1.010–1.025)
UROBILINOGEN UA: 0.2 U/dL

## 2017-10-30 NOTE — Progress Notes (Signed)
ROB, doing well. No complaints. Feels fetal movement. Has an occasional cramp. Reviewed normal discomforts of pregnancy. Follow up 4 wks.   Philip Aspen, CNM

## 2017-10-30 NOTE — Patient Instructions (Signed)

## 2017-10-30 NOTE — Progress Notes (Signed)
ROB- Patient feels well today with no complaints.

## 2017-11-27 ENCOUNTER — Ambulatory Visit (INDEPENDENT_AMBULATORY_CARE_PROVIDER_SITE_OTHER): Payer: 59 | Admitting: Certified Nurse Midwife

## 2017-11-27 ENCOUNTER — Encounter: Payer: Self-pay | Admitting: Certified Nurse Midwife

## 2017-11-27 VITALS — BP 99/56 | HR 88 | Wt 160.5 lb

## 2017-11-27 DIAGNOSIS — Z130 Encounter for screening for diseases of the blood and blood-forming organs and certain disorders involving the immune mechanism: Secondary | ICD-10-CM

## 2017-11-27 DIAGNOSIS — Z131 Encounter for screening for diabetes mellitus: Secondary | ICD-10-CM

## 2017-11-27 DIAGNOSIS — Z113 Encounter for screening for infections with a predominantly sexual mode of transmission: Secondary | ICD-10-CM

## 2017-11-27 DIAGNOSIS — Z3492 Encounter for supervision of normal pregnancy, unspecified, second trimester: Secondary | ICD-10-CM

## 2017-11-27 LAB — POCT URINALYSIS DIPSTICK
Bilirubin, UA: NEGATIVE
Glucose, UA: NEGATIVE
KETONES UA: NEGATIVE
Leukocytes, UA: NEGATIVE
NITRITE UA: NEGATIVE
PH UA: 5 (ref 5.0–8.0)
PROTEIN UA: NEGATIVE
RBC UA: NEGATIVE
Spec Grav, UA: <=1.005 — AB (ref 1.010–1.025)
UROBILINOGEN UA: 0.2 U/dL

## 2017-11-27 NOTE — Patient Instructions (Signed)

## 2017-11-27 NOTE — Progress Notes (Signed)
Pt is here for an [place] visit. States she SOB on exertion is getting worse.

## 2017-11-29 NOTE — Progress Notes (Signed)
ROB-Reports increased exertional dyspnea. Declines albuterol Rx. History significant for probably sarcoidosis. Anticipatory guidance regarding 28 week labs and course of prenatal care. Reviewed red flag symptoms and when to call. RTC x 3 weeks for 28 week labs and ROB or sooner if needed.

## 2017-12-18 ENCOUNTER — Other Ambulatory Visit: Payer: 59 | Admitting: Certified Nurse Midwife

## 2017-12-18 ENCOUNTER — Other Ambulatory Visit: Payer: Self-pay

## 2017-12-18 ENCOUNTER — Ambulatory Visit (INDEPENDENT_AMBULATORY_CARE_PROVIDER_SITE_OTHER): Payer: 59 | Admitting: Certified Nurse Midwife

## 2017-12-18 ENCOUNTER — Other Ambulatory Visit: Payer: 59

## 2017-12-18 VITALS — BP 105/67 | HR 99 | Wt 161.3 lb

## 2017-12-18 DIAGNOSIS — Z131 Encounter for screening for diabetes mellitus: Secondary | ICD-10-CM

## 2017-12-18 DIAGNOSIS — Z113 Encounter for screening for infections with a predominantly sexual mode of transmission: Secondary | ICD-10-CM

## 2017-12-18 DIAGNOSIS — Z3492 Encounter for supervision of normal pregnancy, unspecified, second trimester: Secondary | ICD-10-CM | POA: Diagnosis not present

## 2017-12-18 DIAGNOSIS — Z3483 Encounter for supervision of other normal pregnancy, third trimester: Secondary | ICD-10-CM | POA: Diagnosis not present

## 2017-12-18 DIAGNOSIS — Z130 Encounter for screening for diseases of the blood and blood-forming organs and certain disorders involving the immune mechanism: Secondary | ICD-10-CM

## 2017-12-18 LAB — POCT URINALYSIS DIPSTICK
Bilirubin, UA: NEGATIVE
Glucose, UA: NEGATIVE
KETONES UA: NEGATIVE
Leukocytes, UA: NEGATIVE
NITRITE UA: NEGATIVE
PH UA: 5 (ref 5.0–8.0)
PROTEIN UA: NEGATIVE
RBC UA: NEGATIVE
Spec Grav, UA: 1.01 (ref 1.010–1.025)
UROBILINOGEN UA: 0.2 U/dL

## 2017-12-18 MED ORDER — TETANUS-DIPHTH-ACELL PERTUSSIS 5-2.5-18.5 LF-MCG/0.5 IM SUSP
0.5000 mL | Freq: Once | INTRAMUSCULAR | Status: AC
  Administered 2017-12-18: 0.5 mL via INTRAMUSCULAR

## 2017-12-18 NOTE — Progress Notes (Signed)
ROB, doing well. No complaints. TDAp/BTC/CBC/RPR/1 hr GTT today. Discussed cord blood donation (private & public) . Reviewed birth control after baby. Pamphlet given. Pt encouraged to review and ask questions at next visit . Will follow up with lab results. Return in 2 wks.   Philip Aspen, CNM

## 2017-12-18 NOTE — Patient Instructions (Signed)
Glucose Tolerance Test During Pregnancy The glucose tolerance test (GTT) is a blood test used to determine if you have developed a type of diabetes during pregnancy (gestational diabetes). This is when your body does not properly process sugar (glucose) in the food you eat, resulting in high blood glucose levels. Typically, a GTT is done after you have had a 1-hour glucose test with results that indicate you possibly have gestational diabetes. It may also be done if:  You have a history of giving birth to very large babies or have experienced repeated fetal loss (stillbirth).  You have signs and symptoms of diabetes, such as: ? Changes in your vision. ? Tingling or numbness in your hands or feet. ? Changes in hunger, thirst, and urination not otherwise explained by your pregnancy.  The GTT lasts about 3 hours. You will be given a sugar-water solution to drink at the beginning of the test. You will have blood drawn before you drink the solution and then again 1, 2, and 3 hours after you drink it. You will not be allowed to eat or drink anything else during the test. You must remain at the testing location to make sure that your blood is drawn on time. You should also avoid exercising during the test, because exercise can alter test results. How do I prepare for this test? Eat normally for 3 days prior to the GTT test, including having plenty of carbohydrate-rich foods. Do not eat or drink anything except water during the final 12 hours before the test. In addition, your health care provider may ask you to stop taking certain medicines before the test. What do the results mean? It is your responsibility to obtain your test results. Ask the lab or department performing the test when and how you will get your results. Contact your health care provider to discuss any questions you have about your results. Range of Normal Values Ranges for normal values may vary among different labs and hospitals. You  should always check with your health care provider after having lab work or other tests done to discuss whether your values are considered within normal limits. Normal levels of blood glucose are as follows:  Fasting: less than 105 mg/dL.  1 hour after drinking the solution: less than 190 mg/dL.  2 hours after drinking the solution: less than 165 mg/dL.  3 hours after drinking the solution: less than 145 mg/dL.  Some substances can interfere with GTT results. These may include:  Blood pressure and heart failure medicines, including beta blockers, furosemide, and thiazides.  Anti-inflammatory medicines, including aspirin.  Nicotine.  Some psychiatric medicines.  Meaning of Results Outside Normal Value Ranges GTT test results that are above normal values may indicate a number of health problems, such as:  Gestational diabetes.  Acute stress response.  Cushing syndrome.  Tumors such as pheochromocytoma or glucagonoma.  Long-term kidney problems.  Pancreatitis.  Hyperthyroidism.  Current infection.  Discuss your test results with your health care provider. He or she will use the results to make a diagnosis and determine a treatment plan that is right for you. This information is not intended to replace advice given to you by your health care provider. Make sure you discuss any questions you have with your health care provider. Document Released: 04/09/2012 Document Revised: 03/16/2016 Document Reviewed: 02/13/2014 Elsevier Interactive Patient Education  2018 [employer] Cord Blood Banking Information Cord blood banking is the process of collecting and storing the blood that is in the umbilical   cord and placenta at the time of delivery. This blood contains stem cells, which can be used to treat many blood diseases, immune system disorders, and childhood cancers. Stem cells can also be used to research certain diseases and treatments. Many people who choose cord blood  banking donate the blood. Donated blood can be used in lifesaving treatments or for research. Other people choose to store the blood privately. Blood that is stored privately can only be used with the person's permission. This option is often chosen if:  A family member needs a stem cell transplant.  The child is part of an ethnic minority.  The child was conceived through in vitro fertilization.  What should I look for in a blood bank? A blood bank is the organization that coordinates cord blood banking. Make sure the cord blood bank that you use:  Is accredited.  Is financially stable.  Handles a large volume of cord blood samples.  Has a procedure in place for transport and storage.  Allows you the option of transferring your cord blood sample.  Has a procedure in place if the bank goes out of business.  Clearly states all costs and limits to future costs.  People who choose to donate cord blood should not need to pay for blood banking. People who keep the blood for private use will need to pay for the first (initial) storage and pay a fee each year (annual fee). Other fees may also apply. What are the risks of cord blood banking? There are no health risks associated with cord blood banking. It is considered safe. How should I prepare? You must schedule this process at least 4-6 weeks before you will be giving birth. How is the blood collected? The blood is collected as soon as the baby has been delivered. Within 15 minutes of delivery, a health care provider will take these actions to collect the blood:  Clamp the umbilical cord at the top and bottom. This traps the blood in the umbilical cord.  Use a syringe or bag to collect the blood.  Insert needles into the placenta to collect (draw out) more blood.  What happens after the blood is collected? After the blood has been collected:  The blood will be sent to a blood bank.  The blood will be tested for genetic problems  and infectious diseases. If the blood tests positive for a genetic problem or a disease, someone will contact you and let you know.  The blood will be frozen.  If your child develops a genetic condition, immune system disorder, or cancer, you will be responsible for contacting the blood bank and letting them know. This information is not intended to replace advice given to you by your health care provider. Make sure you discuss any questions you have with your health care provider. Document Released: 03/29/2010 Document Revised: 03/16/2016 Document Reviewed: 03/29/2015 Elsevier Interactive Patient Education  2018 [employer]  

## 2017-12-18 NOTE — Progress Notes (Signed)
Pt is here for an ROB visit. 

## 2017-12-19 LAB — RPR: RPR Ser Ql: NONREACTIVE

## 2017-12-19 LAB — CBC
Hematocrit: 35.7 % (ref 34.0–46.6)
Hemoglobin: 12.2 g/dL (ref 11.1–15.9)
MCH: 32.9 pg (ref 26.6–33.0)
MCHC: 34.2 g/dL (ref 31.5–35.7)
MCV: 96 fL (ref 79–97)
PLATELETS: 215 10*3/uL (ref 150–379)
RBC: 3.71 x10E6/uL — ABNORMAL LOW (ref 3.77–5.28)
RDW: 13.4 % (ref 12.3–15.4)
WBC: 10.6 10*3/uL (ref 3.4–10.8)

## 2017-12-19 LAB — GLUCOSE, 1 HOUR GESTATIONAL: GESTATIONAL DIABETES SCREEN: 114 mg/dL (ref 65–139)

## 2018-01-01 ENCOUNTER — Ambulatory Visit (INDEPENDENT_AMBULATORY_CARE_PROVIDER_SITE_OTHER): Payer: 59 | Admitting: Certified Nurse Midwife

## 2018-01-01 VITALS — BP 113/65 | HR 84 | Wt 164.2 lb

## 2018-01-01 DIAGNOSIS — O2200 Varicose veins of lower extremity in pregnancy, unspecified trimester: Secondary | ICD-10-CM | POA: Insufficient documentation

## 2018-01-01 DIAGNOSIS — Z3483 Encounter for supervision of other normal pregnancy, third trimester: Secondary | ICD-10-CM | POA: Diagnosis not present

## 2018-01-01 DIAGNOSIS — M25552 Pain in left hip: Secondary | ICD-10-CM | POA: Insufficient documentation

## 2018-01-01 DIAGNOSIS — O26893 Other specified pregnancy related conditions, third trimester: Secondary | ICD-10-CM | POA: Insufficient documentation

## 2018-01-01 DIAGNOSIS — R102 Pelvic and perineal pain: Secondary | ICD-10-CM

## 2018-01-01 LAB — POCT URINALYSIS DIPSTICK
BILIRUBIN UA: NEGATIVE
Blood, UA: NEGATIVE
GLUCOSE UA: NEGATIVE
Ketones, UA: NEGATIVE
Leukocytes, UA: NEGATIVE
Nitrite, UA: NEGATIVE
Protein, UA: NEGATIVE
Spec Grav, UA: 1.02 (ref 1.010–1.025)
Urobilinogen, UA: 0.2 E.U./dL
pH, UA: 6 (ref 5.0–8.0)

## 2018-01-01 NOTE — Progress Notes (Signed)
ROB-Reports increased pelvic pain, varicose veins, left hip pain, and shortness of breath despite home treatment measures. Declines cardiology and pulmonology consults at this time. Agrees to reduce workload and think about physical therapy consult. Reviewed red flag symptoms and when to call. RTC x 2 week for ROB or sooner if needed.

## 2018-01-01 NOTE — Patient Instructions (Signed)
Braxton Hicks Contractions °Contractions of the uterus can occur throughout pregnancy, but they are not always a sign that you are in labor. You may have practice contractions called Braxton Hicks contractions. These false labor contractions are sometimes confused with true labor. °What are Braxton Hicks contractions? °Braxton Hicks contractions are tightening movements that occur in the muscles of the uterus before labor. Unlike true labor contractions, these contractions do not result in opening (dilation) and thinning of the cervix. Toward the end of pregnancy (32-34 weeks), Braxton Hicks contractions can happen more often and may become stronger. These contractions are sometimes difficult to tell apart from true labor because they can be very uncomfortable. You should not feel embarrassed if you go to the hospital with false labor. °Sometimes, the only way to tell if you are in true labor is for your health care provider to look for changes in the cervix. The health care provider will do a physical exam and may monitor your contractions. If you are not in true labor, the exam should show that your cervix is not dilating and your water has not broken. °If there are other health problems associated with your pregnancy, it is completely safe for you to be sent home with false labor. You may continue to have Braxton Hicks contractions until you go into true labor. °How to tell the difference between true labor and false labor °True labor °· Contractions last 30-70 seconds. °· Contractions become very regular. °· Discomfort is usually felt in the top of the uterus, and it spreads to the lower abdomen and low back. °· Contractions do not go away with walking. °· Contractions usually become more intense and increase in frequency. °· The cervix dilates and gets thinner. °False labor °· Contractions are usually shorter and not as strong as true labor contractions. °· Contractions are usually irregular. °· Contractions  are often felt in the front of the lower abdomen and in the groin. °· Contractions may go away when you walk around or change positions while lying down. °· Contractions get weaker and are shorter-lasting as time goes on. °· The cervix usually does not dilate or become thin. °Follow these instructions at home: °· Take over-the-counter and prescription medicines only as told by your health care provider. °· Keep up with your usual exercises and follow other instructions from your health care provider. °· Eat and drink lightly if you think you are going into labor. °· If Braxton Hicks contractions are making you uncomfortable: °? Change your position from lying down or resting to walking, or change from walking to resting. °? Sit and rest in a tub of warm water. °? Drink enough fluid to keep your urine pale yellow. Dehydration may cause these contractions. °? Do slow and deep breathing several times an hour. °· Keep all follow-up prenatal visits as told by your health care provider. This is important. °Contact a health care provider if: °· You have a fever. °· You have continuous pain in your abdomen. °Get help right away if: °· Your contractions become stronger, more regular, and closer together. °· You have fluid leaking or gushing from your vagina. °· You pass blood-tinged mucus (bloody show). °· You have bleeding from your vagina. °· You have low back pain that you never had before. °· You feel your baby’s head pushing down and causing pelvic pressure. °· Your baby is not moving inside you as much as it used to. °Summary °· Contractions that occur before labor are called Braxton   Hicks contractions, false labor, or practice contractions. °· Braxton Hicks contractions are usually shorter, weaker, farther apart, and less regular than true labor contractions. True labor contractions usually become progressively stronger and regular and they become more frequent. °· Manage discomfort from Braxton Hicks contractions by  changing position, resting in a warm bath, drinking plenty of water, or practicing deep breathing. °This information is not intended to replace advice given to you by your health care provider. Make sure you discuss any questions you have with your health care provider. °Document Released: 02/22/2017 Document Revised: 02/22/2017 Document Reviewed: 02/22/2017 °Elsevier Interactive Patient Education © 2018 [employer] ° °Fetal Movement Counts °Patient Name: ________________________________________________ Patient Due Date: ____________________ °What is a fetal movement count? °A fetal movement count is the number of times that you feel your baby move during a certain amount of time. This may also be called a fetal kick count. A fetal movement count is recommended for every pregnant woman. You may be asked to start counting fetal movements as early as week 28 of your pregnancy. °Pay attention to when your baby is most active. You may notice your baby's sleep and wake cycles. You may also notice things that make your baby move more. You should do a fetal movement count: °· When your baby is normally most active. °· At the same time each day. ° °A good time to count movements is while you are resting, after having something to eat and drink. °How do I count fetal movements? °1. Find a quiet, comfortable area. Sit, or lie down on your side. °2. Write down the date, the start time and stop time, and the number of movements that you felt between those two times. Take this information with you to your health care visits. °3. For 2 hours, count kicks, flutters, swishes, rolls, and jabs. You should feel at least 10 movements during 2 hours. °4. You may stop counting after you have felt 10 movements. °5. If you do not feel 10 movements in 2 hours, have something to eat and drink. Then, keep resting and counting for 1 hour. If you feel at least 4 movements during that hour, you may stop counting. °Contact a health care  provider if: °· You feel fewer than 4 movements in 2 hours. °· Your baby is not moving like he or she usually does. °Date: ____________ Start time: ____________ Stop time: ____________ Movements: ____________ °Date: ____________ Start time: ____________ Stop time: ____________ Movements: ____________ °Date: ____________ Start time: ____________ Stop time: ____________ Movements: ____________ °Date: ____________ Start time: ____________ Stop time: ____________ Movements: ____________ °Date: ____________ Start time: ____________ Stop time: ____________ Movements: ____________ °Date: ____________ Start time: ____________ Stop time: ____________ Movements: ____________ °Date: ____________ Start time: ____________ Stop time: ____________ Movements: ____________ °Date: ____________ Start time: ____________ Stop time: ____________ Movements: ____________ °Date: ____________ Start time: ____________ Stop time: ____________ Movements: ____________ °This information is not intended to replace advice given to you by your health care provider. Make sure you discuss any questions you have with your health care provider. °Document Released: 11/08/2006 Document Revised: 06/07/2016 Document Reviewed: 11/18/2015 °Elsevier Interactive Patient Education © 2018 [employer] ° °

## 2018-01-01 NOTE — Progress Notes (Signed)
Pt is here for an ROB visit. 

## 2018-01-01 NOTE — Addendum Note (Signed)
Addended by: Cherre Huger on: 01/01/2018 09:30 AM   Modules accepted: Orders

## 2018-01-15 ENCOUNTER — Ambulatory Visit (INDEPENDENT_AMBULATORY_CARE_PROVIDER_SITE_OTHER): Payer: 59 | Admitting: Obstetrics and Gynecology

## 2018-01-15 VITALS — BP 110/68 | HR 93 | Wt 166.4 lb

## 2018-01-15 DIAGNOSIS — Z3493 Encounter for supervision of normal pregnancy, unspecified, third trimester: Secondary | ICD-10-CM

## 2018-01-15 LAB — POCT URINALYSIS DIPSTICK
BILIRUBIN UA: NEGATIVE
GLUCOSE UA: NEGATIVE
KETONES UA: NEGATIVE
Leukocytes, UA: NEGATIVE
Nitrite, UA: NEGATIVE
Protein, UA: NEGATIVE
RBC UA: NEGATIVE
Spec Grav, UA: 1.01 (ref 1.010–1.025)
Urobilinogen, UA: 0.2 E.U./dL
pH, UA: 6 (ref 5.0–8.0)

## 2018-01-15 NOTE — Progress Notes (Signed)
ROB- pt is having some contractions, has been going on for 4 weeks

## 2018-01-15 NOTE — Progress Notes (Signed)
ROB-discussed postpartum, circumcision, to bring [employer]ML[employer] pap ers next visit.

## 2018-01-30 ENCOUNTER — Ambulatory Visit (INDEPENDENT_AMBULATORY_CARE_PROVIDER_SITE_OTHER): Payer: 59 | Admitting: Obstetrics and Gynecology

## 2018-01-30 VITALS — BP 112/68 | HR 98 | Wt 169.7 lb

## 2018-01-30 DIAGNOSIS — Z3493 Encounter for supervision of normal pregnancy, unspecified, third trimester: Secondary | ICD-10-CM | POA: Diagnosis not present

## 2018-01-30 LAB — POCT URINALYSIS DIPSTICK
BILIRUBIN UA: NEGATIVE
Blood, UA: NEGATIVE
Glucose, UA: NEGATIVE
Ketones, UA: NEGATIVE
LEUKOCYTES UA: NEGATIVE
NITRITE UA: NEGATIVE
PROTEIN UA: NEGATIVE
Spec Grav, UA: 1.01 (ref 1.010–1.025)
Urobilinogen, UA: 0.2 E.U./dL
pH, UA: 6.5 (ref 5.0–8.0)

## 2018-01-30 NOTE — Progress Notes (Signed)
ROB- pt is having a lot of pelvic pressure, some contractions

## 2018-01-30 NOTE — Progress Notes (Signed)
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 Worse about half way through work day as she is on her feet the whole time. Will stop work at end of this week until delivery.

## 2018-02-05 ENCOUNTER — Encounter: Payer: 59 | Admitting: Obstetrics and Gynecology

## 2018-02-12 ENCOUNTER — Ambulatory Visit (INDEPENDENT_AMBULATORY_CARE_PROVIDER_SITE_OTHER): Payer: 59 | Admitting: Obstetrics and Gynecology

## 2018-02-12 VITALS — BP 109/66 | HR 86 | Wt 170.5 lb

## 2018-02-12 DIAGNOSIS — Z3493 Encounter for supervision of normal pregnancy, unspecified, third trimester: Secondary | ICD-10-CM

## 2018-02-12 DIAGNOSIS — Z3685 Encounter for antenatal screening for Streptococcus B: Secondary | ICD-10-CM

## 2018-02-12 DIAGNOSIS — Z113 Encounter for screening for infections with a predominantly sexual mode of transmission: Secondary | ICD-10-CM

## 2018-02-12 LAB — POCT URINALYSIS DIPSTICK
Bilirubin, UA: NEGATIVE
Blood, UA: NEGATIVE
Glucose, UA: NEGATIVE
Ketones, UA: NEGATIVE
LEUKOCYTES UA: NEGATIVE
NITRITE UA: NEGATIVE
PH UA: 6 (ref 5.0–8.0)
PROTEIN UA: NEGATIVE
SPEC GRAV UA: 1.01 (ref 1.010–1.025)
UROBILINOGEN UA: 0.2 U/dL

## 2018-02-12 NOTE — Progress Notes (Signed)
ROB & cultures- doing well, labor precautions discussed.talked about delivery in 39th week due to last delivery with fractured clavical. Will discuss with Deneise Lever at next visit

## 2018-02-12 NOTE — Progress Notes (Signed)
ROB- cultures obtained, pt is feeling some better, pt is having a lot of pelvic pressure

## 2018-02-14 LAB — GC/CHLAMYDIA PROBE AMP
CHLAMYDIA, DNA PROBE: NEGATIVE
NEISSERIA GONORRHOEAE BY PCR: NEGATIVE

## 2018-02-14 LAB — STREP GP B NAA: Strep Gp B NAA: NEGATIVE

## 2018-02-19 ENCOUNTER — Encounter (INDEPENDENT_AMBULATORY_CARE_PROVIDER_SITE_OTHER): Payer: Self-pay

## 2018-02-20 ENCOUNTER — Encounter: Payer: 59 | Admitting: Certified Nurse Midwife

## 2018-02-20 ENCOUNTER — Encounter: Payer: Self-pay | Admitting: Certified Nurse Midwife

## 2018-02-20 ENCOUNTER — Ambulatory Visit (INDEPENDENT_AMBULATORY_CARE_PROVIDER_SITE_OTHER): Payer: 59 | Admitting: Certified Nurse Midwife

## 2018-02-20 VITALS — BP 106/71 | HR 98 | Wt 172.3 lb

## 2018-02-20 DIAGNOSIS — Z3493 Encounter for supervision of normal pregnancy, unspecified, third trimester: Secondary | ICD-10-CM | POA: Diagnosis not present

## 2018-02-20 LAB — POCT URINALYSIS DIPSTICK
Bilirubin, UA: NEGATIVE
Glucose, UA: NEGATIVE
Ketones, UA: NEGATIVE
LEUKOCYTES UA: NEGATIVE
NITRITE UA: NEGATIVE
PH UA: 6 (ref 5.0–8.0)
PROTEIN UA: NEGATIVE
RBC UA: NEGATIVE
SPEC GRAV UA: 1.015 (ref 1.010–1.025)
UROBILINOGEN UA: 0.2 U/dL

## 2018-02-20 NOTE — Patient Instructions (Signed)
Braxton Hicks Contractions °Contractions of the uterus can occur throughout pregnancy, but they are not always a sign that you are in labor. You may have practice contractions called Braxton Hicks contractions. These false labor contractions are sometimes confused with true labor. °What are Braxton Hicks contractions? °Braxton Hicks contractions are tightening movements that occur in the muscles of the uterus before labor. Unlike true labor contractions, these contractions do not result in opening (dilation) and thinning of the cervix. Toward the end of pregnancy (32-34 weeks), Braxton Hicks contractions can happen more often and may become stronger. These contractions are sometimes difficult to tell apart from true labor because they can be very uncomfortable. You should not feel embarrassed if you go to the hospital with false labor. °Sometimes, the only way to tell if you are in true labor is for your health care provider to look for changes in the cervix. The health care provider will do a physical exam and may monitor your contractions. If you are not in true labor, the exam should show that your cervix is not dilating and your water has not broken. °If there are other health problems associated with your pregnancy, it is completely safe for you to be sent home with false labor. You may continue to have Braxton Hicks contractions until you go into true labor. °How to tell the difference between true labor and false labor °True labor °· Contractions last 30-70 seconds. °· Contractions become very regular. °· Discomfort is usually felt in the top of the uterus, and it spreads to the lower abdomen and low back. °· Contractions do not go away with walking. °· Contractions usually become more intense and increase in frequency. °· The cervix dilates and gets thinner. °False labor °· Contractions are usually shorter and not as strong as true labor contractions. °· Contractions are usually irregular. °· Contractions  are often felt in the front of the lower abdomen and in the groin. °· Contractions may go away when you walk around or change positions while lying down. °· Contractions get weaker and are shorter-lasting as time goes on. °· The cervix usually does not dilate or become thin. °Follow these instructions at home: °· Take over-the-counter and prescription medicines only as told by your health care provider. °· Keep up with your usual exercises and follow other instructions from your health care provider. °· Eat and drink lightly if you think you are going into labor. °· If Braxton Hicks contractions are making you uncomfortable: °? Change your position from lying down or resting to walking, or change from walking to resting. °? Sit and rest in a tub of warm water. °? Drink enough fluid to keep your urine pale yellow. Dehydration may cause these contractions. °? Do slow and deep breathing several times an hour. °· Keep all follow-up prenatal visits as told by your health care provider. This is important. °Contact a health care provider if: °· You have a fever. °· You have continuous pain in your abdomen. °Get help right away if: °· Your contractions become stronger, more regular, and closer together. °· You have fluid leaking or gushing from your vagina. °· You pass blood-tinged mucus (bloody show). °· You have bleeding from your vagina. °· You have low back pain that you never had before. °· You feel your baby’s head pushing down and causing pelvic pressure. °· Your baby is not moving inside you as much as it used to. °Summary °· Contractions that occur before labor are called Braxton   Hicks contractions, false labor, or practice contractions. °· Braxton Hicks contractions are usually shorter, weaker, farther apart, and less regular than true labor contractions. True labor contractions usually become progressively stronger and regular and they become more frequent. °· Manage discomfort from Braxton Hicks contractions by  changing position, resting in a warm bath, drinking plenty of water, or practicing deep breathing. °This information is not intended to replace advice given to you by your health care provider. Make sure you discuss any questions you have with your health care provider. °Document Released: 02/22/2017 Document Revised: 02/22/2017 Document Reviewed: 02/22/2017 °Elsevier Interactive Patient Education © 2018 [employer]vier Inc. ° °

## 2018-02-20 NOTE — Progress Notes (Signed)
ROB doing well. Feels good fetal movement. Will schedule for induction @ 39 wks. She is requesting induction due to hx of last baby  9 lbs with clavicle fracture. Follow up at [facility] for induction .   Philip Aspen, CNM

## 2018-02-20 NOTE — Progress Notes (Signed)
Pt is here for an ROB visit. 

## 2018-02-27 ENCOUNTER — Other Ambulatory Visit: Payer: Self-pay | Admitting: Certified Nurse Midwife

## 2018-02-28 ENCOUNTER — Inpatient Hospital Stay
Admission: EM | Admit: 2018-02-28 | Discharge: 2018-03-01 | DRG: 807 | Disposition: A | Discharge status: Home / Self Care | Payer: 59 | Attending: Certified Nurse Midwife | Admitting: Certified Nurse Midwife

## 2018-02-28 ENCOUNTER — Other Ambulatory Visit: Payer: Self-pay

## 2018-02-28 ENCOUNTER — Encounter: Payer: Self-pay | Admitting: *Deleted

## 2018-02-28 DIAGNOSIS — M419 Scoliosis, unspecified: Secondary | ICD-10-CM | POA: Diagnosis present

## 2018-02-28 DIAGNOSIS — O26893 Other specified pregnancy related conditions, third trimester: Secondary | ICD-10-CM | POA: Diagnosis present

## 2018-02-28 DIAGNOSIS — Z3483 Encounter for supervision of other normal pregnancy, third trimester: Secondary | ICD-10-CM | POA: Diagnosis present

## 2018-02-28 DIAGNOSIS — Z3A39 39 weeks gestation of pregnancy: Secondary | ICD-10-CM

## 2018-02-28 DIAGNOSIS — O874 Varicose veins of lower extremity in the puerperium: Secondary | ICD-10-CM | POA: Diagnosis present

## 2018-02-28 DIAGNOSIS — O326XX0 Maternal care for compound presentation, not applicable or unspecified: Secondary | ICD-10-CM | POA: Diagnosis present

## 2018-02-28 LAB — CBC
HCT: 34 % — ABNORMAL LOW (ref 35.0–47.0)
Hemoglobin: 11.9 g/dL — ABNORMAL LOW (ref 12.0–16.0)
MCH: 33.4 pg (ref 26.0–34.0)
MCHC: 34.9 g/dL (ref 32.0–36.0)
MCV: 95.7 fL (ref 80.0–100.0)
Platelets: 197 10*3/uL (ref 150–440)
RBC: 3.55 MIL/uL — ABNORMAL LOW (ref 3.80–5.20)
RDW: 13 % (ref 11.5–14.5)
WBC: 8.3 10*3/uL (ref 3.6–11.0)

## 2018-02-28 LAB — TYPE AND SCREEN
ABO/RH(D): O POS
Antibody Screen: NEGATIVE

## 2018-02-28 MED ORDER — SOD CITRATE-CITRIC ACID 500-334 MG/5ML PO SOLN: 30.0000 mL | ORAL | Status: DC | PRN

## 2018-02-28 MED ORDER — FERROUS SULFATE 325 (65 FE) MG PO TABS
325.0000 mg | ORAL_TABLET | Freq: Every day | ORAL | Status: DC
  Administered 2018-03-01: 325 mg via ORAL
  Filled 2018-02-28: qty 1

## 2018-02-28 MED ORDER — ACETAMINOPHEN 325 MG PO TABS: 650.0000 mg | ORAL_TABLET | ORAL | Status: DC | PRN

## 2018-02-28 MED ORDER — DOCUSATE SODIUM 100 MG PO CAPS
100.0000 mg | ORAL_CAPSULE | Freq: Two times a day (BID) | ORAL | Status: DC
  Administered 2018-02-28 – 2018-03-01 (×2): 100 mg via ORAL
  Filled 2018-02-28 (×2): qty 1

## 2018-02-28 MED ORDER — SIMETHICONE 80 MG PO CHEW: 80.0000 mg | CHEWABLE_TABLET | ORAL | Status: DC | PRN

## 2018-02-28 MED ORDER — BUTORPHANOL TARTRATE 1 MG/ML IJ SOLN: 1.0000 mg | INTRAMUSCULAR | Status: DC | PRN

## 2018-02-28 MED ORDER — DIBUCAINE 1 % RE OINT: 1.0000 "application " | TOPICAL_OINTMENT | RECTAL | Status: DC | PRN

## 2018-02-28 MED ORDER — IBUPROFEN 600 MG PO TABS
600.0000 mg | ORAL_TABLET | Freq: Four times a day (QID) | ORAL | Status: DC
  Administered 2018-02-28 – 2018-03-01 (×5): 600 mg via ORAL
  Filled 2018-02-28 (×5): qty 1

## 2018-02-28 MED ORDER — SENNOSIDES-DOCUSATE SODIUM 8.6-50 MG PO TABS
2.0000 | ORAL_TABLET | ORAL | Status: DC
  Filled 2018-02-28: qty 2

## 2018-02-28 MED ORDER — ONDANSETRON HCL 4 MG/2ML IJ SOLN: 4.0000 mg | INTRAMUSCULAR | Status: DC | PRN

## 2018-02-28 MED ORDER — OXYTOCIN 10 UNIT/ML IJ SOLN: 10.0000 [IU] | Freq: Once | INTRAMUSCULAR | Status: DC

## 2018-02-28 MED ORDER — SODIUM CHLORIDE FLUSH 0.9 % IV SOLN
INTRAVENOUS | Status: AC
  Filled 2018-02-28: qty 10

## 2018-02-28 MED ORDER — FENTANYL 2.5 MCG/ML W/ROPIVACAINE 0.15% IN NS 100 ML EPIDURAL (ARMC)
EPIDURAL | Status: AC
  Filled 2018-02-28: qty 100

## 2018-02-28 MED ORDER — LIDOCAINE HCL (PF) 1 % IJ SOLN: 30.0000 mL | INTRAMUSCULAR | Status: DC | PRN

## 2018-02-28 MED ORDER — METHYLERGONOVINE MALEATE 0.2 MG PO TABS: 0.2000 mg | ORAL_TABLET | ORAL | Status: DC | PRN

## 2018-02-28 MED ORDER — OXYTOCIN BOLUS FROM INFUSION
500.0000 mL | Freq: Once | INTRAVENOUS | Status: AC
  Administered 2018-02-28: 500 mL via INTRAVENOUS

## 2018-02-28 MED ORDER — METHYLERGONOVINE MALEATE 0.2 MG/ML IJ SOLN: 0.2000 mg | INTRAMUSCULAR | Status: DC | PRN

## 2018-02-28 MED ORDER — OXYTOCIN 10 UNIT/ML IJ SOLN
INTRAMUSCULAR | Status: AC
  Filled 2018-02-28: qty 2

## 2018-02-28 MED ORDER — LACTATED RINGERS IV SOLN
INTRAVENOUS | Status: DC
  Administered 2018-02-28 (×2): via INTRAVENOUS

## 2018-02-28 MED ORDER — OXYTOCIN 40 UNITS IN LACTATED RINGERS INFUSION - SIMPLE MED
2.5000 [IU]/h | INTRAVENOUS | Status: DC
  Administered 2018-02-28: 2.5 [IU]/h via INTRAVENOUS
  Filled 2018-02-28: qty 1000

## 2018-02-28 MED ORDER — WITCH HAZEL-GLYCERIN EX PADS: 1.0000 "application " | MEDICATED_PAD | CUTANEOUS | Status: DC | PRN

## 2018-02-28 MED ORDER — OXYCODONE-ACETAMINOPHEN 5-325 MG PO TABS
ORAL_TABLET | ORAL | Status: AC
  Filled 2018-02-28: qty 1

## 2018-02-28 MED ORDER — AMMONIA AROMATIC IN INHA
RESPIRATORY_TRACT | Status: AC
  Filled 2018-02-28: qty 10

## 2018-02-28 MED ORDER — LACTATED RINGERS IV SOLN: 500.0000 mL | INTRAVENOUS | Status: DC | PRN

## 2018-02-28 MED ORDER — LIDOCAINE HCL (PF) 1 % IJ SOLN
INTRAMUSCULAR | Status: AC
  Filled 2018-02-28: qty 30

## 2018-02-28 MED ORDER — BENZOCAINE-MENTHOL 20-0.5 % EX AERO: 1.0000 "application " | INHALATION_SPRAY | CUTANEOUS | Status: DC | PRN

## 2018-02-28 MED ORDER — PRENATAL MULTIVITAMIN CH
1.0000 | ORAL_TABLET | Freq: Every day | ORAL | Status: DC
  Administered 2018-03-01: 1 via ORAL
  Filled 2018-02-28: qty 1

## 2018-02-28 MED ORDER — MISOPROSTOL 25 MCG QUARTER TABLET
50.0000 ug | ORAL_TABLET | ORAL | Status: DC
  Administered 2018-02-28 (×2): 50 ug via VAGINAL
  Filled 2018-02-28 (×2): qty 1

## 2018-02-28 MED ORDER — ONDANSETRON HCL 4 MG PO TABS
4.0000 mg | ORAL_TABLET | ORAL | Status: DC | PRN
Start: 2018-02-28 — End: 2018-03-01

## 2018-02-28 MED ORDER — OXYCODONE-ACETAMINOPHEN 5-325 MG PO TABS
1.0000 | ORAL_TABLET | ORAL | Status: DC | PRN
  Administered 2018-02-28 – 2018-03-01 (×3): 1 via ORAL
  Filled 2018-02-28: qty 1

## 2018-02-28 MED ORDER — COCONUT OIL OIL: 1.0000 "application " | TOPICAL_OIL | Status: DC | PRN

## 2018-02-28 MED ORDER — OXYCODONE-ACETAMINOPHEN 5-325 MG PO TABS
2.0000 | ORAL_TABLET | ORAL | Status: DC | PRN
  Filled 2018-02-28: qty 2

## 2018-02-28 MED ORDER — SODIUM CHLORIDE 0.9 % IV SOLN: INTRAVENOUS | Status: DC

## 2018-02-28 MED ORDER — MISOPROSTOL 200 MCG PO TABS
ORAL_TABLET | ORAL | Status: AC
  Administered 2018-02-28: 50 ug via VAGINAL
  Filled 2018-02-28: qty 4

## 2018-02-28 MED ORDER — ONDANSETRON HCL 4 MG/2ML IJ SOLN: 4.0000 mg | Freq: Four times a day (QID) | INTRAMUSCULAR | Status: DC | PRN

## 2018-02-28 MED ORDER — TERBUTALINE SULFATE 1 MG/ML IJ SOLN: 0.2500 mg | Freq: Once | INTRAMUSCULAR | Status: DC | PRN

## 2018-02-28 NOTE — Progress Notes (Signed)
LABOR NOTE   Angela Bush 37 y.o.GP@ at [redacted]w[redacted]d Early latent labor.  SUBJECTIVE:  She is starting to become uncomfortable with contractions OBJECTIVE:  BP 120/65 (BP Location: Right Arm)   Pulse 90   Temp 98.2 F (36.8 C) (Oral)   Resp 16   Ht 5\' 3"  (1.6 m)   Wt 170 lb (77.1 kg)   LMP 05/31/2017   BMI 30.11 kg/m  Total I/O In: 177.1 [I.V.:177.1] Out: -   She has shown cervical change. CERVIX: 2.5:  70%:   -2:   mid position:   soft SVE:   Dilation: 2.5 Effacement (%): 70 Station: -2 Exam by:: A. Grandville Silos, CNM CONTRACTIONS: irregular, every 2-5 minutes FHR: Fetal heart tracing reviewed. Baseline: 130 bpm, Variability: Good {> 6 bpm), Accelerations: Reactive and Decelerations: Absent Category I   Analgesia: Labor support without medications  Labs: Lab Results  Component Value Date   WBC 8.3 02/28/2018   HGB 11.9 (L) 02/28/2018   HCT 34.0 (L) 02/28/2018   MCV 95.7 02/28/2018   PLT 197 02/28/2018    ASSESSMENT: 1) Labor curve reviewed.       Progress: Early latent labor.     Membranes: intact       Active Problems:   Labor and delivery, indication for care   PLAN: continue present management  Philip Aspen, CNM . 02/28/2018 1:09 PM

## 2018-02-28 NOTE — Progress Notes (Signed)
LABOR NOTE   Angela Bush 37 y.o.GP@ at [redacted]w[redacted]d Not in labor.  SUBJECTIVE:  Is comfortable, denies pain  OBJECTIVE:  BP 120/65 (BP Location: Right Arm)   Pulse 90   Temp 98.2 F (36.8 C) (Oral)   Resp 16   Ht 5\' 3"  (1.6 m)   Wt 170 lb (77.1 kg)   LMP 05/31/2017   BMI 30.11 kg/m  No intake/output data recorded.  She has shown cervical change. CERVIX: 1.5:  60%:   -2:   posterior:   soft SVE:   Dilation: 1.5 Effacement (%): 60 Station: -2 Exam by:: A. Grandville Silos, CNM CONTRACTIONS: irregular FHR: Fetal heart tracing reviewed. Baseline: 150 bpm, Variability: Good {> 6 bpm), Accelerations: Reactive and Decelerations: Absent Category I   Analgesia: Labor support without medications  Labs: Lab Results  Component Value Date   WBC 8.3 02/28/2018   HGB 11.9 (L) 02/28/2018   HCT 34.0 (L) 02/28/2018   MCV 95.7 02/28/2018   PLT 197 02/28/2018    ASSESSMENT: 1) Labor curve reviewed.       Progress: Not in labor.     Membranes: intact       Active Problems:   Labor and delivery, indication for care   PLAN: continue present management, second dose of 50 mcg Cytotec placed vaginally  Philip Aspen, CNM  02/28/2018 10:49 AM

## 2018-02-28 NOTE — OB Triage Note (Signed)
Recvd pt from ED. Pt here for scheduled IOL. Feeling baby move well.

## 2018-02-28 NOTE — H&P (Signed)
History and Physical   HPI  Angela Bush is a 37 y.o. [id number] at [redacted]w[redacted]d Estimated Date of Delivery: 03/07/18 who is being admitted for induction of labor   OB History  OB History  Gravida Para Term Preterm AB Living  6 2 1 1 1 3   SAB TAB Ectopic Multiple Live Births  1 0 0 0 3    # Outcome Date GA Lbr Len/2nd Weight Sex Delivery Anes PTL Lv  6 Current           5 Term 06/21/15 [redacted]w[redacted]d 06:43 / 00:10 7 lb 15 oz (3.6 kg) F Vag-Spont None  LIV     Name: SHERETHA, SHADD     Apgar1: 8  Apgar5: 9  4 Gravida 04/24/11    F Vag-Spont   LIV  3 Gravida 08/24/07    M Vag-Spont   LIV  2 Preterm 2007     Vag-Spont   FD  1 SAB         ND    PROBLEM LIST  Pregnancy complications or risks: Patient Active Problem List   Diagnosis Date Noted  . Labor and delivery, indication for care 02/28/2018  . Left hip pain 01/01/2018  . Varicose veins during pregnancy, antepartum 01/01/2018  . Pelvic pain affecting pregnancy in third trimester, antepartum 01/01/2018  . Opacity noted on imaging study   . Paroxysmal supraventricular tachycardia ([place]) 01/18/2015    Prenatal labs and studies: ABO, Rh: --/--/PENDING (05/09 4580) Antibody: PENDING (05/09 0707) Rubella: 2.37 (10/15 9983) RPR: Non Reactive (02/26 1232)  HBsAg: Negative (10/15 0937)  HIV: Non Reactive (10/15 3825)  KNL:ZJQBHALP (04/23 1139)   Past Medical History:  Diagnosis Date  . Hx of hypercholesterolemia   . Pneumonia   . SVT (supraventricular tachycardia) (H[facility])      Past Surgical History:  Procedure Laterality Date  . BACK SURGERY     x4  . DILATION AND CURETTAGE OF UTERUS  9 2015   x2  . ELECTROMAGNETIC NAVIGATION BROCHOSCOPY N/A 01/17/2017   Procedure: ELECTROMAGNETIC NAVIGATION BRONCHOSCOPY;  Surgeon: [name], MD;  Location: [facility] ORS;  Service: Cardiopulmonary;  Laterality: N/A;  . LASIK    . TONSILLECTOMY       Medications    Current Discharge Medication List    CONTINUE these medications which  have NOT CHANGED   Details  Prenatal Vit-Fe Fumarate-FA (MULTIVITAMIN-PRENATAL) 27-0.8 MG TABS tablet Take 1 tablet by mouth daily at 12 noon.         Allergies  Latex  Review of Systems  Constitutional: negative Eyes: negative Ears, nose, mouth, throat, and face: negative Respiratory: negative Cardiovascular: negative Gastrointestinal: negative Genitourinary:negative Integument/breast: negative Hematologic/lymphatic: negative Musculoskeletal:negative Neurological: negative Behavioral/Psych: negative Endocrine: negative Allergic/Immunologic: negative  Physical Exam  BP 116/74 (BP Location: Left Arm)   Pulse 90   Temp 98.2 F (36.8 C) (Oral)   Resp 16   Ht 5\' 3"  (1.6 m)   Wt 170 lb (77.1 kg)   LMP 05/31/2017   BMI 30.11 kg/m   Lungs:  CTA  Cardio: RRR  Abd: Soft, gravid, NT Presentation: cephalic EXT: No C/C/ 1+ Edema DTRs: 2+ B CERVIX: not evaluated  : per RN exam  See Prenatal records for more detailed PE.     FHR:  Baseline: 145 bpm, Variability: Good {> 6 bpm), Accelerations: Reactive and Decelerations: Absent  Toco: Uterine Contractions: irregular mild   Test Results  Results for orders placed or performed during the hospital encounter of 02/28/18 (from  the past 24 hour(s))  CBC     Status: Abnormal   Collection Time: 02/28/18  6:56 AM  Result Value Ref Range   WBC 8.3 3.6 - 11.0 K/uL   RBC 3.55 (L) 3.80 - 5.20 MIL/uL   Hemoglobin 11.9 (L) 12.0 - 16.0 g/dL   HCT 34.0 (L) 35.0 - 47.0 %   MCV 95.7 80.0 - 100.0 fL   MCH 33.4 26.0 - 34.0 pg   MCHC 34.9 32.0 - 36.0 g/dL   RDW 13.0 11.5 - 14.5 %   Platelets 197 150 - 440 K/uL  Type and screen     Status: None (Preliminary result)   Collection Time: 02/28/18  7:07 AM  Result Value Ref Range   ABO/RH(D) PENDING    Antibody Screen PENDING    Sample Expiration      03/03/2018 Performed at [facility], [address] [place], [place] [address]    Group B Strep  negative  Assessment   [id number] at [redacted]w[redacted]d Estimated Date of Delivery: 03/07/18  The fetus is reassuring.   Patient Active Problem List   Diagnosis Date Noted  . Labor and delivery, indication for care 02/28/2018  . Left hip pain 01/01/2018  . Varicose veins during pregnancy, antepartum 01/01/2018  . Pelvic pain affecting pregnancy in third trimester, antepartum 01/01/2018  . Opacity noted on imaging study   . Paroxysmal supraventricular tachycardia ([place]) 01/18/2015    Plan  1. Admit to L&D :   continue present management and cytotec  2. EFM:-- Category 1 3. Epidural if desired. Stadol for IV pain until epidural requested. 4. Admission labs  5. Anticipate NSVD  Philip Aspen, CNM  02/28/2018 7:32 AM

## 2018-02-28 NOTE — Plan of Care (Signed)
Induction and plan of care discussed with pt and husband.

## 2018-02-28 NOTE — Consult Note (Signed)
[name] Anesthesia Consultation  HAISLEE CORSO ZSW:[id number] DOB: 10/02/1981 DOA: 02/28/2018 PCP: Juline Patch, MD   Requesting physician: Philip Aspen Date of consultation: 02/28/18 Reason for consultation: hx of multiple spine surgeries for scoliosis   HISTORY OF PRESENT ILLNESS: Angela Bush  is a 37 y.o. female with a known history of multiple spine surgeries for scoliosis. She has had some hardware removed in the past but has one rod remaining on the R side that extends to L4. Pt is wondering about having epidural for pain control during labor.  PAST MEDICAL HISTORY:   Past Medical History:  Diagnosis Date  . Hx of hypercholesterolemia   . Pneumonia   . SVT (supraventricular tachycardia) ([place])     PAST SURGICAL HISTORY:  Past Surgical History:  Procedure Laterality Date  . BACK SURGERY     x4  . DILATION AND CURETTAGE OF UTERUS  9 2015   x2  . ELECTROMAGNETIC NAVIGATION BROCHOSCOPY N/A 01/17/2017   Procedure: ELECTROMAGNETIC NAVIGATION BRONCHOSCOPY;  Surgeon: [name], MD;  Location: [facility] ORS;  Service: Cardiopulmonary;  Laterality: N/A;  . LASIK    . TONSILLECTOMY      SOCIAL HISTORY:  Social History   Tobacco Use  . Smoking status: Never Smoker  . Smokeless tobacco: Never Used  Substance Use Topics  . Alcohol use: Yes    Comment: rare    FAMILY HISTORY:  Family History  Problem Relation Age of Onset  . Hypertension Father   . Hyperlipidemia Father     DRUG ALLERGIES:  Allergies  Allergen Reactions  . Latex Itching, Swelling and Other (See Comments)    Redness      MEDICATIONS AT HOME:  Prior to Admission medications   Medication Sig Start Date End Date Taking? Authorizing Provider  Prenatal Vit-Fe Fumarate-FA (MULTIVITAMIN-PRENATAL) 27-0.8 MG TABS tablet Take 1 tablet by mouth daily at 12 noon.   Yes [provider]   Lab Results  Component Value Date   WBC 8.3 02/28/2018   HGB 11.9  (L) 02/28/2018   HCT 34.0 (L) 02/28/2018   MCV 95.7 02/28/2018   PLT 197 02/28/2018    IMPRESSION AND PLAN:   Angela Bush  is a 37 y.o. female presenting for IOL due to hx of precipitous delivery and hx of multiple spine surgeries and is requesting possible epidural for labor analgesia.  We discussed that there are some increased risks to epidural placement in patients with hx of spine surgery with hardware including infection necessitating hardware removal, accidental dural puncture resulting in PDPH, incomplete effectiveness of analgesia and inability to successfully place epidural. Her hardware appears to be unilateral on the R with no crossing hardware. We discussed that while there are increased risks, the risk of serious complication remains low and that if she would like to have an epidural attempted we would be able to proceed with that when her labor has progressed to a point that she would desire it.

## 2018-02-28 NOTE — Progress Notes (Signed)
LABOR NOTE   Angela Bush 37 y.o.GP@ at [redacted]w[redacted]d Early latent labor.  SUBJECTIVE:  uncomfortable with contractions, requesting epidural. OBJECTIVE:  BP 122/79   Pulse 91   Temp 98 F (36.7 C) (Oral)   Resp 18   Ht 5\' 3"  (1.6 m)   Wt 170 lb (77.1 kg)   LMP 05/31/2017   BMI 30.11 kg/m  Total I/O In: 177.1 [I.V.:177.1] Out: -   She has shown cervical change. CERVIX: 3 cm:  70%:   -2:   mid position:   soft SVE:   Dilation: 3 Effacement (%): 70 Station: -2 Exam by:: A. Grandville Silos, CNM CONTRACTIONS: regular, every 1-3 minutes FHR: Fetal heart tracing reviewed. Baseline: 145 bpm, Variability: Good {> 6 bpm), Accelerations: Reactive and Decelerations: Absent Category I   Analgesia: requesting epidural  Labs: Lab Results  Component Value Date   WBC 8.3 02/28/2018   HGB 11.9 (L) 02/28/2018   HCT 34.0 (L) 02/28/2018   MCV 95.7 02/28/2018   PLT 197 02/28/2018    ASSESSMENT: 1) Labor curve reviewed.       Progress: Early latent labor.     Membranes: ruptured, clear fluid       Active Problems:   Labor and delivery, indication for care   PLAN: continue present management  Philip Aspen, CNM  02/28/2018 3:04 PM

## 2018-03-01 LAB — CBC
HCT: 33.6 % — ABNORMAL LOW (ref 35.0–47.0)
Hemoglobin: 11.4 g/dL — ABNORMAL LOW (ref 12.0–16.0)
MCH: 33 pg (ref 26.0–34.0)
MCHC: 34 g/dL (ref 32.0–36.0)
MCV: 97.2 fL (ref 80.0–100.0)
Platelets: 197 10*3/uL (ref 150–440)
RBC: 3.46 MIL/uL — AB (ref 3.80–5.20)
RDW: 13.4 % (ref 11.5–14.5)
WBC: 13.6 10*3/uL — AB (ref 3.6–11.0)

## 2018-03-01 LAB — RPR: RPR: NONREACTIVE

## 2018-03-01 NOTE — Final Progress Note (Signed)
Discharge Day SOAP Note:  Progress Note - Vaginal Delivery  Angela Bush is a 37 y.o. [phone] now PP day 1 s/p Vaginal, Spontaneous . Delivery was uncomplicated  Subjective  The patient has the following complaints: has no unusual complaints  Pain is controlled with current medications.   Patient is urinating without difficulty.  She is ambulating well.    Objective  Vital signs: BP 126/84 (BP Location: Left Arm)   Pulse 79   Temp 98 F (36.7 C) (Oral)   Resp 20   Ht 5\' 3"  (1.6 m)   Wt 170 lb (77.1 kg)   LMP 05/31/2017   SpO2 98%   Breastfeeding? Unknown   BMI 30.11 kg/m   Physical Exam: Gen: NAD Fundus Fundal Tone: Firm  Lochia Amount: Small  Perineum Appearance: Intact     Data Review Labs: CBC Latest Ref Rng & Units 03/01/2018 02/28/2018 12/18/2017  WBC 3.6 - 11.0 K/uL 13.6(H) 8.3 10.6  Hemoglobin 12.0 - 16.0 g/dL 11.4(L) 11.9(L) 12.2  Hematocrit 35.0 - 47.0 % 33.6(L) 34.0(L) 35.7  Platelets 150 - 440 K/uL 197 197 215   O POS  Assessment/Plan  Active Problems:   Labor and delivery, indication for care    Plan for discharge today.   Discharge Instructions: Per After Visit Summary. Activity: Advance as tolerated. Pelvic rest for 6 weeks.  Also refer to After Visit Summary Diet: Regular Medications: Allergies as of 03/01/2018      Reactions   Latex Itching, Swelling, Other (See Comments)   Redness       Medication List    TAKE these medications   multivitamin-prenatal 27-0.8 MG Tabs tablet Take 1 tablet by mouth daily at 12 noon.      Outpatient follow up:  Postpartum contraception: Husband had vasectomy  Discharged Condition: good  Discharged to: home  Newborn Data: Disposition:home with mother  Apgars: APGAR (1 MIN): 8   APGAR (5 MINS): 9   APGAR (10 MINS):    Baby Feeding: [place], CNM  03/01/2018 5:32 PM

## 2018-03-01 NOTE — Discharge Summary (Signed)
Discharge Summary  Date of Admission: 02/28/2018  Date of Discharge: 03/01/2018  Admitting Diagnosis: Induction of labor at [redacted]w[redacted]d  Mode of Delivery: normal spontaneous vaginal delivery                 Discharge Diagnosis: No other diagnosis   Intrapartum Procedures: Atificial rupture of membranes   Post partum procedures: none  Complications: none                      Discharge Day SOAP Note:  Progress Note - Vaginal Delivery  Angela Bush is a 37 y.o. [id number] now PP day 1 s/p Vaginal, Spontaneous . Delivery was uncomplicated  Subjective  The patient has the following complaints: has no unusual complaints  Pain is controlled with current medications.   Patient is urinating without difficulty.  She is ambulating well.    Objective  Vital signs: BP 126/84 (BP Location: Left Arm)   Pulse 79   Temp 98 F (36.7 C) (Oral)   Resp 20   Ht 5\' 3"  (1.6 m)   Wt 170 lb (77.1 kg)   LMP 05/31/2017   SpO2 98%   Breastfeeding? Unknown   BMI 30.11 kg/m   Physical Exam: Gen: NAD Fundus Fundal Tone: Firm  Lochia Amount: Small  Perineum Appearance: Intact     Data Review Labs: CBC Latest Ref Rng & Units 03/01/2018 02/28/2018 12/18/2017  WBC 3.6 - 11.0 K/uL 13.6(H) 8.3 10.6  Hemoglobin 12.0 - 16.0 g/dL 11.4(L) 11.9(L) 12.2  Hematocrit 35.0 - 47.0 % 33.6(L) 34.0(L) 35.7  Platelets 150 - 440 K/uL 197 197 215   O POS  Assessment/Plan  Active Problems:   Labor and delivery, indication for care    Plan for discharge today.   Discharge Instructions: Per After Visit Summary. Activity: Advance as tolerated. Pelvic rest for 6 weeks.  Also refer to After Visit Summary Diet: Regular Medications: Allergies as of 03/01/2018      Reactions   Latex Itching, Swelling, Other (See Comments)   Redness       Medication List    TAKE these medications   multivitamin-prenatal 27-0.8 MG Tabs tablet Take 1 tablet by mouth daily at 12 noon.       Outpatient follow up:  Postpartum contraception: Husband had vasectomy  Discharged Condition: good  Discharged to: home  Newborn Data: Disposition:home with mother  Apgars: APGAR (1 MIN): 8   APGAR (5 MINS): 9   APGAR (10 MINS):    Baby Feeding: [place], CNM  03/01/2018 5:32 PM

## 2018-03-01 NOTE — Progress Notes (Signed)
Patient discharged home with infant. Discharge instructions and prescriptions given and reviewed with patient. Patient verbalized understanding. Escorted out by staff.  

## 2018-03-05 ENCOUNTER — Telehealth: Payer: Self-pay | Admitting: Internal Medicine

## 2018-03-05 ENCOUNTER — Encounter: Payer: Self-pay | Admitting: Internal Medicine

## 2018-03-05 NOTE — Telephone Encounter (Signed)
3 attempts to schedule fu appt from recall list.   Deleting recall.  °Mailed Letter  °

## 2018-03-05 NOTE — Telephone Encounter (Signed)
I would give more attempts Patient is pregnant and soon to deliver

## 2018-03-08 NOTE — Telephone Encounter (Signed)
No ans no vm   °

## 2018-03-11 IMAGING — CT NM PET TUM IMG INITIAL (PI) SKULL BASE T - THIGH
9 series · 19 of 25 positions shown · non-contrast
Comparison: 12/20/2016

CLINICAL DATA: Initial treatment strategy for right upper lobe lung
mass..

EXAM:
NUCLEAR MEDICINE PET SKULL BASE TO THIGH
TECHNIQUE: 11.98 mCi F-18 FDG was injected intravenously. Full-ring PET imaging
was performed from the skull base to thigh after the radiotracer. CT
data was obtained and used for attenuation correction and anatomic
localization.
FASTING BLOOD GLUCOSE:  Value: 85 mg/dl

[Series 3: ct wb 5.0 b30f · axial · 5.0mm · 0.98mm/px · z∈[-1413,-546]mm · 4 of 290 slices shown]
[im 1/290]
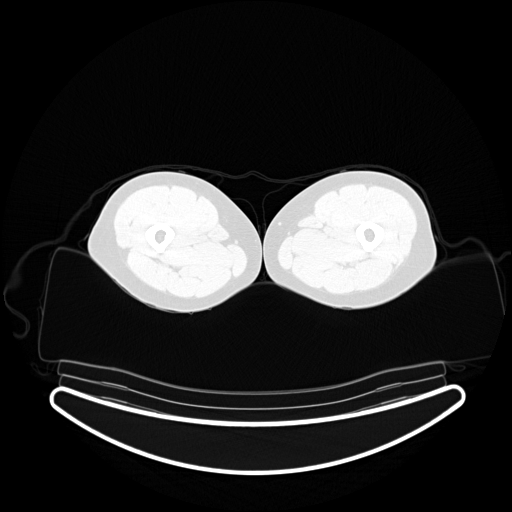
[im 73/290]
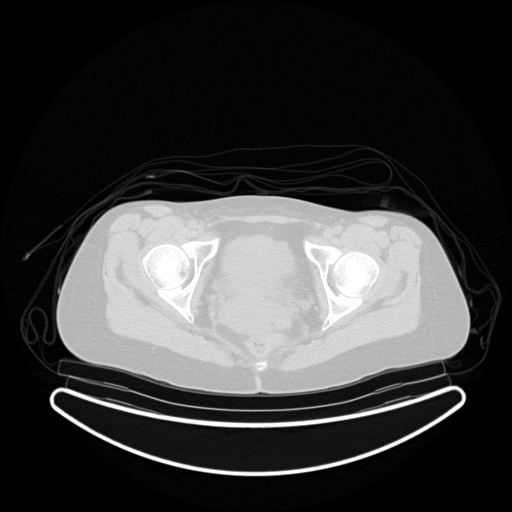
[im 217/290]
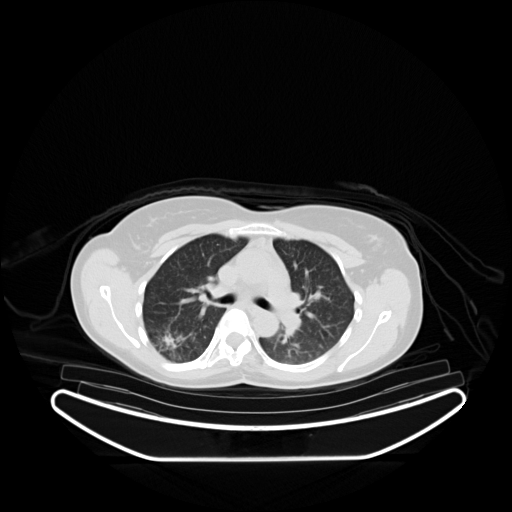
[im 290/290  brain]
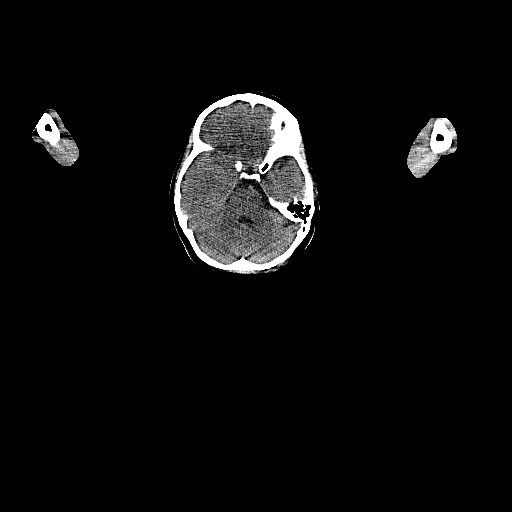

[Series 4: pet wb (ac) · axial · 5.0mm · 4.07mm/px · z∈[-1413,-546]mm · 3 of 290 slices shown]
[im 1/290]
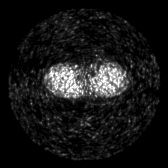
[im 193/290]
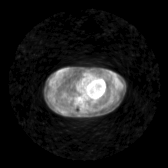
[im 290/290]
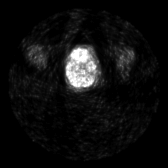

[Series 5: pet wb uncorrected (nac) · axial · 5.0mm · 4.07mm/px · z∈[-1413,-546]mm · 3 of 290 slices shown]
[im 1/290]
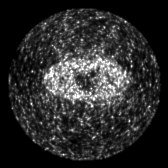
[im 193/290]
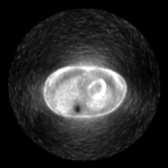
[im 290/290]
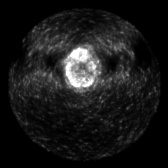

[Series 603: fused axial · 3 of 290 slices shown]
[im 1/290]
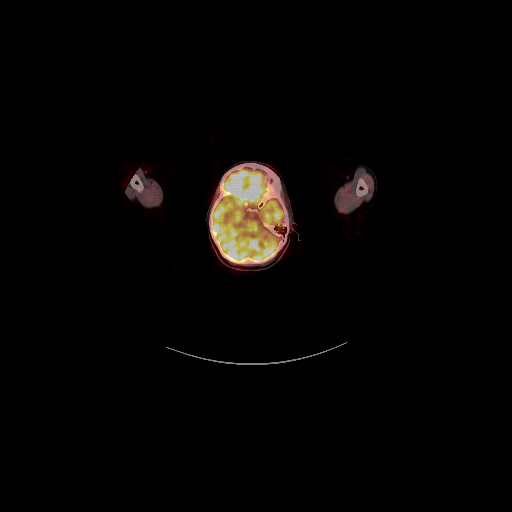
[im 193/290]
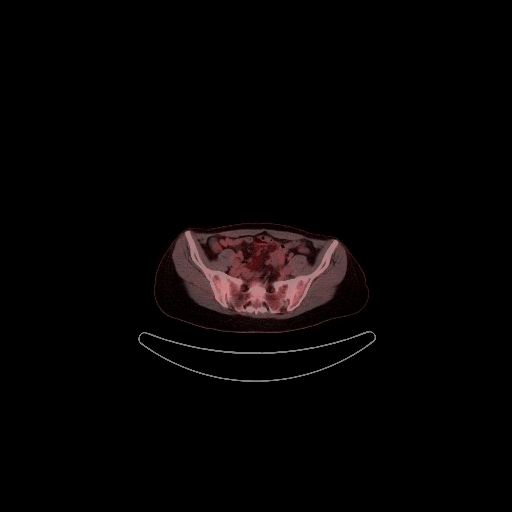
[im 290/290]
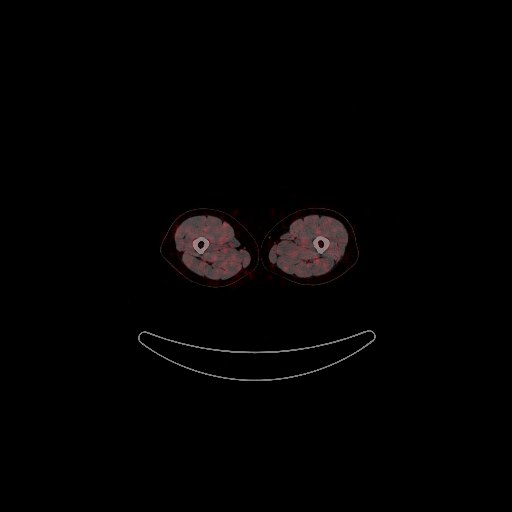

[Series 604: fused coronal · 1 of 89 slices shown]
[im 1/89]
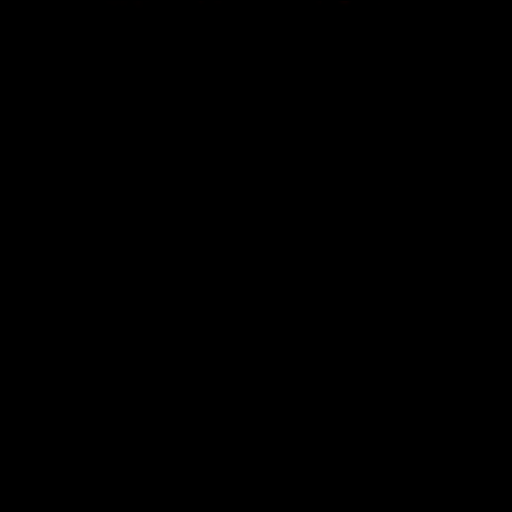

[Series 605: fused sagittal · 1 of 137 slices shown]
[im 137/137]
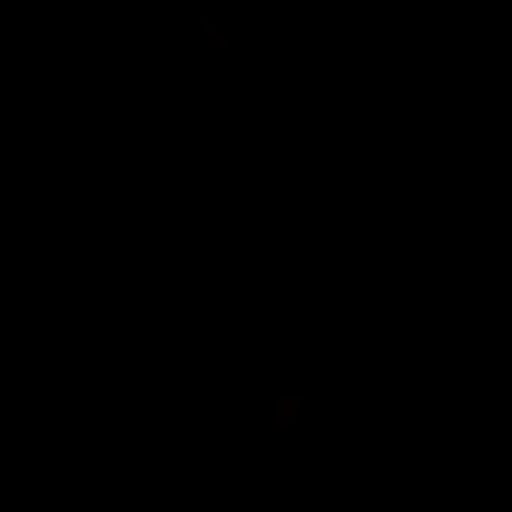

[Series 607: pet coronal · 2 of 105 slices shown]
[im 1/105]
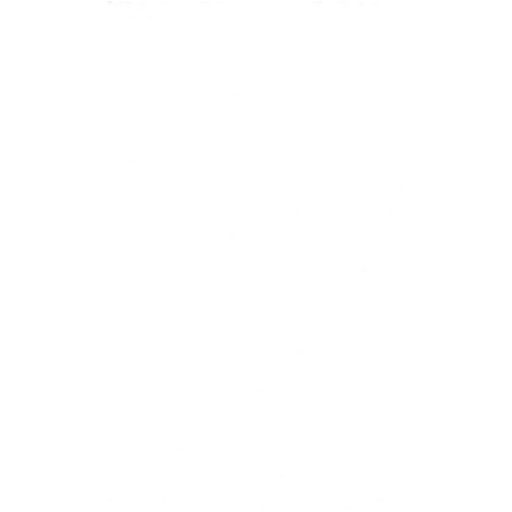
[im 105/105]
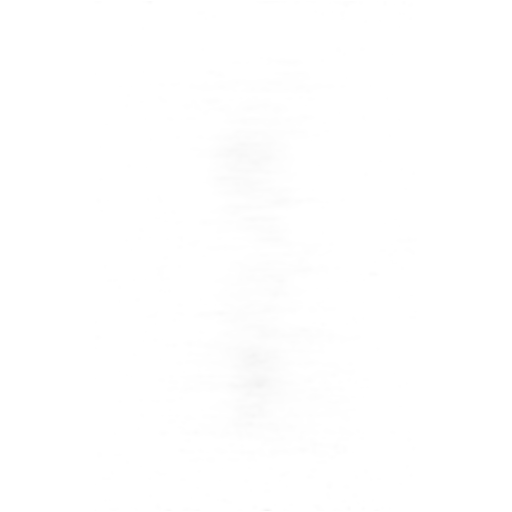

[Series 608: pet sagittal · 1 of 137 slices shown]
[im 137/137]
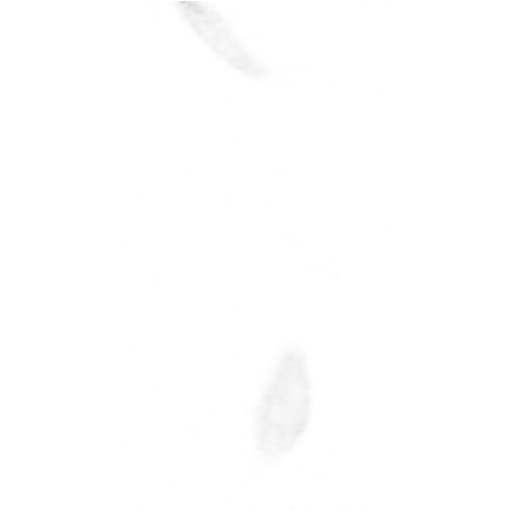

[Series 1038: results mm oncology reading · 0.51mm/px · 1 of 4 slices shown]
[im 1/4]
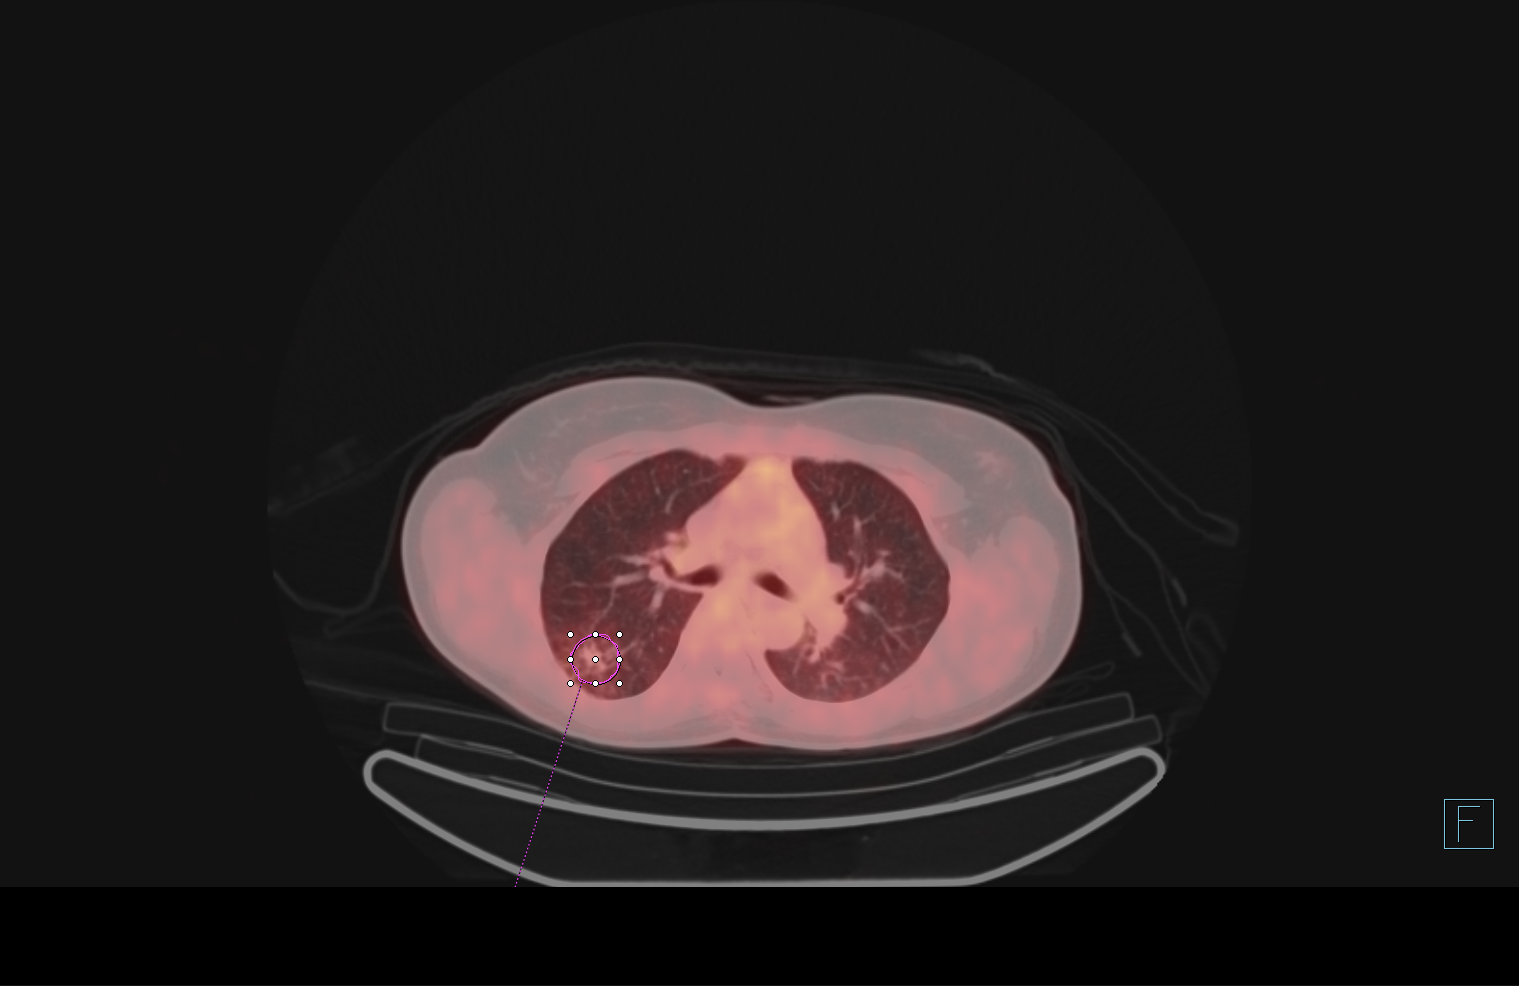

[19 of 25 positions shown; findings below may reference images not displayed]

FINDINGS: NECK

No hypermetabolic lymph nodes in the neck.

CHEST

No hypermetabolic mediastinal or hilar adenopathy. Similar to the
previous exam is upper and mid lung zone predominant
peribronchovascular nodularity and mild architectural distortion.
The ill defined nodular area with surrounding architectural
distortion in the superior segment of the right lower lobe measures
approximately 1.5 cm and has an SUV max equal to 1.4. Other
scattered nodules are too small to reliably characterize by PET-CT.

ABDOMEN/PELVIS

No abnormal hypermetabolic activity within the liver, pancreas, or
adrenal glands. There are multifocal areas of increased uptake
within the spleen corresponding to the low-attenuation foci
identified on CT from 12/20/2016. Index lesion within the lateral
spleen measures 1.44 and has an SUV max equal to 5.23. No
hypermetabolic lymph nodes in the abdomen or pelvis.

SKELETON

No focal hypermetabolic activity to suggest skeletal metastasis.
IMPRESSION: 1. Again noted is perilymphatic nodularity and multiple lesions
within the liver and spleen most consistent with sarcoid.
2. The largest area of pulmonary nodularity/architectural distortion
is within the superior segment of the right lower lobe and exhibits
mild, low level, non malignant range FDG uptake.
3. Multifocal areas of increased uptake within the spleen correspond
with low-attenuation lesions identified on recent CT from 12/20/2016
and may be consistent with granulomas secondary to sarcoid.

## 2018-04-08 ENCOUNTER — Ambulatory Visit: Payer: 59 | Admitting: Internal Medicine

## 2018-04-08 ENCOUNTER — Encounter: Payer: Self-pay | Admitting: Internal Medicine

## 2018-04-08 VITALS — BP 112/68 | HR 75 | Resp 16 | Ht 63.0 in | Wt 152.0 lb

## 2018-04-08 DIAGNOSIS — D869 Sarcoidosis, unspecified: Secondary | ICD-10-CM | POA: Diagnosis not present

## 2018-04-08 NOTE — Progress Notes (Signed)
  [facility] Consultation      Date: 04/08/2018,   MRN# [id number] Angela Bush 1981-03-09    AdmissionWeight: 152 lb (68.9 kg)                 CurrentWeight: 152 lb (68.9 kg) Angela Bush is a 37 y.o. old female seen in consultation for abnormal CT chest at the request of Dr. Ronnald Ramp.     CHIEF COMPLAINT:   Follow up sarcoidosis   HISTORY OF PRESENT ILLNESS   Patient dx with sacroidosis Hilar adenopathy +liver lesions Dx with EBUS last year  No significant SOB/DOE No resp symptoms at this time S/p post partum 5 weeks  No signs of infection or CHF at this time Last eye exam WNL  No need for inhalers or steroids at this time   2.patient   REVIEW OF SYSTEMS   Review of Systems  Constitutional: Negative for chills, diaphoresis, fever, malaise/fatigue and weight loss.  HENT: Negative for congestion and hearing loss.   Eyes: Negative for blurred vision and double vision.  Respiratory: Negative for cough, hemoptysis, sputum production, shortness of breath and wheezing.   Cardiovascular: Negative for chest pain, palpitations and orthopnea.  Gastrointestinal: Negative for abdominal pain, blood in stool, constipation, diarrhea, heartburn, nausea and vomiting.  Genitourinary: Negative for dysuria and urgency.  Musculoskeletal: Negative for back pain, myalgias and neck pain.  Skin: Negative for rash.  Neurological: Negative for dizziness, tingling, tremors, weakness and headaches.  Endo/Heme/Allergies: Does not bruise/bleed easily.  Psychiatric/Behavioral: Negative for depression, substance abuse and suicidal ideas.  All other systems reviewed and are negative.    VS: BP 112/68 (BP Location: Left Arm, Cuff Size: Normal)   Pulse 75   Resp 16   Ht 5\' 3"  (1.6 m)   Wt 152 lb (68.9 kg)   LMP 05/31/2017   SpO2 99%   BMI 26.93 kg/m      PHYSICAL EXAM  Physical Exam  Constitutional: She is oriented to person, place, and time. She appears  well-developed and well-nourished. No distress.  HENT:  Head: Normocephalic and atraumatic.  Mouth/Throat: No oropharyngeal exudate.  Eyes: Pupils are equal, round, and reactive to light. EOM are normal. No scleral icterus.  Neck: Normal range of motion. Neck supple.  Cardiovascular: Normal rate, regular rhythm and normal heart sounds.  No murmur heard. Pulmonary/Chest: No stridor. No respiratory distress. She has no wheezes.  Abdominal: Soft. Bowel sounds are normal.  Musculoskeletal: Normal range of motion. She exhibits no edema.  Neurological: She is alert and oriented to person, place, and time. No cranial nerve deficit.  Skin: Skin is warm. She is not diaphoretic.  Psychiatric: She has a normal mood and affect.        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  [facility]  Medical Director [place] Director [place] Department

## 2018-04-08 NOTE — Patient Instructions (Signed)
Follow up as needed

## 2018-04-11 NOTE — Telephone Encounter (Signed)
complete

## 2018-04-12 ENCOUNTER — Encounter: Payer: Self-pay | Admitting: Certified Nurse Midwife

## 2018-04-12 ENCOUNTER — Ambulatory Visit (INDEPENDENT_AMBULATORY_CARE_PROVIDER_SITE_OTHER): Payer: 59 | Admitting: Certified Nurse Midwife

## 2018-04-12 NOTE — Progress Notes (Signed)
Subjective:    Angela Bush is a 37 y.o. [phone] Caucasian female who presents for a postpartum visit. She is 6 weeks postpartum following a spontaneous vaginal delivery at 73 gestational weeks. Anesthesia: nitrous Oxide. I have fully reviewed the prenatal and intrapartum course. Postpartum course has been WNL. Baby's course has been WNL. Baby is feeding by bottle. Bleeding no bleeding. Bowel function is normal. Bladder function is normal. Patient is not sexually active. Last sexual activity: prior to delivery. Contraception method is vasectomy. Postpartum depression screening: negative. Score 1.  Last pap 04/03/13 and was Negative/HPV negative.  The following portions of the patient's history were reviewed and updated as appropriate: allergies, current medications, past medical history, past surgical history and problem list.  Review of Systems Pertinent items are noted in HPI.   Vitals:   04/12/18 0848  BP: 106/69  Pulse: 77  Weight: 151 lb (68.5 kg)  Height: 5\' 3"  (1.6 m)   Patient's last menstrual period was 05/31/2017.  Objective:   General:  alert, cooperative and no distress   Breasts:  deferred, no complaints  Lungs: clear to auscultation bilaterally  Heart:  regular rate and rhythm  Abdomen: soft, nontender   Vulva: normal  Vagina: normal vagina  Cervix:  closed  Corpus: Well-involuted  Adnexa:  Non-palpable  Rectal Exam: no hemorrhoids        Assessment:   Postpartum exam 6 wks s/p SVD Bottle feeding Depression screening Contraception counseling   Plan:  : vasectomy Follow up in: 6 months for annual exam with pap smear or earlier if needed  Philip Aspen, CNM

## 2018-04-12 NOTE — Progress Notes (Signed)
Pt is here for a post partum visit. Is bottle feeding, has not had a period, does not want bc, has not resumed intercourse.Screening 1

## 2018-04-12 NOTE — Patient Instructions (Signed)
Preventive Care 18-39 Years, Female Preventive care refers to lifestyle choices and visits with your health care provider that can promote health and wellness. What does preventive care include?  A yearly physical exam. This is also called an annual well check.  Dental exams once or twice a year.  Routine eye exams. Ask your health care provider how often you should have your eyes checked.  Personal lifestyle choices, including: ? Daily care of your teeth and gums. ? Regular physical activity. ? Eating a healthy diet. ? Avoiding tobacco and drug use. ? Limiting alcohol use. ? Practicing safe sex. ? Taking vitamin and mineral supplements as recommended by your health care provider. What happens during an annual well check? The services and screenings done by your health care provider during your annual well check will depend on your age, overall health, lifestyle risk factors, and family history of disease. Counseling Your health care provider may ask you questions about your:  Alcohol use.  Tobacco use.  Drug use.  Emotional well-being.  Home and relationship well-being.  Sexual activity.  Eating habits.  Work and work Statistician.  Method of birth control.  Menstrual cycle.  Pregnancy history.  Screening You may have the following tests or measurements:  Height, weight, and BMI.  Diabetes screening. This is done by checking your blood sugar (glucose) after you have not eaten for a while (fasting).  Blood pressure.  Lipid and cholesterol levels. These may be checked every 5 years starting at age 66.  Skin check.  Hepatitis C blood test.  Hepatitis B blood test.  Sexually transmitted disease (STD) testing.  BRCA-related cancer screening. This may be done if you have a family history of breast, ovarian, tubal, or peritoneal cancers.  Pelvic exam and Pap test. This may be done every 3 years starting at age 40. Starting at age 59, this may be done every 5  years if you have a Pap test in combination with an HPV test.  Discuss your test results, treatment options, and if necessary, the need for more tests with your health care provider. Vaccines Your health care provider may recommend certain vaccines, such as:  Influenza vaccine. This is recommended every year.  Tetanus, diphtheria, and acellular pertussis (Tdap, Td) vaccine. You may need a Td booster every 10 years.  Varicella vaccine. You may need this if you have not been vaccinated.  HPV vaccine. If you are 69 or younger, you may need three doses over 6 months.  Measles, mumps, and rubella (MMR) vaccine. You may need at least one dose of MMR. You may also need a second dose.  Pneumococcal 13-valent conjugate (PCV13) vaccine. You may need this if you have certain conditions and were not previously vaccinated.  Pneumococcal polysaccharide (PPSV23) vaccine. You may need one or two doses if you smoke cigarettes or if you have certain conditions.  Meningococcal vaccine. One dose is recommended if you are age 27-21 years and a first-year college student living in a residence hall, or if you have one of several medical conditions. You may also need additional booster doses.  Hepatitis A vaccine. You may need this if you have certain conditions or if you travel or work in places where you may be exposed to hepatitis A.  Hepatitis B vaccine. You may need this if you have certain conditions or if you travel or work in places where you may be exposed to hepatitis B.  Haemophilus influenzae type b (Hib) vaccine. You may need this if  you have certain risk factors.  Talk to your health care provider about which screenings and vaccines you need and how often you need them. This information is not intended to replace advice given to you by your health care provider. Make sure you discuss any questions you have with your health care provider. Document Released: 12/05/2001 Document Revised: 06/28/2016  Document Reviewed: 08/10/2015 Elsevier Interactive Patient Education  [employer].

## 2018-06-30 ENCOUNTER — Telehealth: Payer: 59 | Admitting: Family

## 2018-06-30 DIAGNOSIS — J019 Acute sinusitis, unspecified: Secondary | ICD-10-CM

## 2018-06-30 MED ORDER — AMOXICILLIN-POT CLAVULANATE 875-125 MG PO TABS: 1.0000 | ORAL_TABLET | Freq: Two times a day (BID) | ORAL | 0 refills | Status: DC

## 2018-06-30 NOTE — Progress Notes (Signed)

## 2018-08-06 DIAGNOSIS — D485 Neoplasm of uncertain behavior of skin: Secondary | ICD-10-CM | POA: Diagnosis not present

## 2018-08-06 DIAGNOSIS — D225 Melanocytic nevi of trunk: Secondary | ICD-10-CM | POA: Diagnosis not present

## 2018-08-06 DIAGNOSIS — Z1283 Encounter for screening for malignant neoplasm of skin: Secondary | ICD-10-CM | POA: Diagnosis not present

## 2018-08-06 DIAGNOSIS — D229 Melanocytic nevi, unspecified: Secondary | ICD-10-CM | POA: Diagnosis not present

## 2018-08-06 DIAGNOSIS — L812 Freckles: Secondary | ICD-10-CM | POA: Diagnosis not present

## 2018-10-28 ENCOUNTER — Other Ambulatory Visit (HOSPITAL_COMMUNITY)
Admission: RE | Admit: 2018-10-28 | Discharge: 2018-10-28 | Disposition: A | Discharge status: Home / Self Care | Payer: 59 | Source: Ambulatory Visit | Attending: Certified Nurse Midwife | Admitting: Certified Nurse Midwife

## 2018-10-28 ENCOUNTER — Encounter: Payer: Self-pay | Admitting: Certified Nurse Midwife

## 2018-10-28 ENCOUNTER — Ambulatory Visit (INDEPENDENT_AMBULATORY_CARE_PROVIDER_SITE_OTHER): Payer: 59 | Admitting: Certified Nurse Midwife

## 2018-10-28 VITALS — BP 105/63 | HR 87 | Ht 63.0 in | Wt 146.1 lb

## 2018-10-28 DIAGNOSIS — Z124 Encounter for screening for malignant neoplasm of cervix: Secondary | ICD-10-CM | POA: Diagnosis not present

## 2018-10-28 DIAGNOSIS — D869 Sarcoidosis, unspecified: Secondary | ICD-10-CM

## 2018-10-28 DIAGNOSIS — Z01419 Encounter for gynecological examination (general) (routine) without abnormal findings: Secondary | ICD-10-CM | POA: Diagnosis not present

## 2018-10-28 DIAGNOSIS — N6310 Unspecified lump in the right breast, unspecified quadrant: Secondary | ICD-10-CM

## 2018-10-28 NOTE — Progress Notes (Signed)
GYNECOLOGY ANNUAL PREVENTATIVE CARE ENCOUNTER NOTE  Subjective:   Angela Bush is a 38 y.o. [phone] female here for a routine annual gynecologic exam.  Current complaints: complains of right ovarian pain , she thinks she has a cyst.   Denies abnormal vaginal bleeding, discharge, pelvic pain, problems with intercourse or other gynecologic concerns.    Gynecologic History Patient's last menstrual period was 10/15/2018 (exact date). Contraception: vasectomy N/A  Obstetric History OB History  Gravida Para Term Preterm AB Living  6 3 2 1 1 4   SAB TAB Ectopic Multiple Live Births  1 0 0 0 4    # Outcome Date GA Lbr Len/2nd Weight Sex Delivery Anes PTL Lv  6 Term 02/28/18 [redacted]w[redacted]d / 00:03 7 lb 7 oz (3.374 kg) M Vag-Spont Other  LIV  5 Term 06/21/15 [redacted]w[redacted]d 06:43 / 00:10 7 lb 15 oz (3.6 kg) F Vag-Spont None  LIV  4 Gravida 04/24/11    F Vag-Spont   LIV  3 Gravida 08/24/07    M Vag-Spont   LIV  2 Preterm 2007     Vag-Spont   FD  1 SAB         ND    Past Medical History:  Diagnosis Date  . Hx of hypercholesterolemia   . Pneumonia   . SVT (supraventricular tachycardia) (HCC)     Past Surgical History:  Procedure Laterality Date  . BACK SURGERY     x4  . DILATION AND CURETTAGE OF UTERUS  9 2015   x2  . ELECTROMAGNETIC NAVIGATION BROCHOSCOPY N/A 01/17/2017   Procedure: ELECTROMAGNETIC NAVIGATION BRONCHOSCOPY;  Surgeon: [name], MD;  Location: [facility] ORS;  Service: Cardiopulmonary;  Laterality: N/A;  . LASIK    . TONSILLECTOMY      Current Outpatient Medications on File Prior to Visit  Medication Sig Dispense Refill  . amoxicillin-clavulanate (AUGMENTIN) 875-125 MG tablet Take 1 tablet by mouth 2 (two) times daily. 14 tablet 0   Current Facility-Administered Medications on File Prior to Visit  Medication Dose Route Frequency Provider Last Rate Last Dose  . ipratropium-albuterol (DUONEB) 0.5-2.5 (3) MG/3ML nebulizer solution 3 mL  3 mL Nebulization Once Caryn Section Linden Dolin,  PA-C        Allergies  Allergen Reactions  . Latex Itching, Swelling and Other (See Comments)    Redness     Social History:  reports that she has never smoked. She has never used smokeless tobacco. She reports current alcohol use. She reports that she does not use drugs.  Family History  Problem Relation Age of Onset  . Hypertension Father   . Hyperlipidemia Father     The following portions of the patient's history were reviewed and updated as appropriate: allergies, current medications, past family history, past medical history, past social history, past surgical history and problem list.  Exercising Boot camp 3 x week No smoking, occasional alcohol, no drugs  Review of Systems Pertinent items noted in HPI and remainder of comprehensive ROS otherwise negative.   Objective:  BP 105/63   Pulse 87   Ht 5\' 3"  (1.6 m)   Wt 146 lb 1 oz (66.3 kg)   LMP 10/15/2018 (Exact Date)   Breastfeeding No   BMI 25.87 kg/m  CONSTITUTIONAL: Well-developed, well-nourished female in no acute distress.  HENT:  Normocephalic, atraumatic, External right and left ear normal. Oropharynx is clear and moist EYES: Conjunctivae and EOM are normal. Pupils are equal, round, and reactive to light. No scleral icterus.  NECK: Normal range of motion, supple, no masses.  Normal thyroid.  SKIN: Skin is warm and dry. No rash noted. Not diaphoretic. No erythema. No pallor. MUSCULOSKELETAL: Normal range of motion. No tenderness.  No cyanosis, clubbing, or edema.  2+ distal pulses. NEUROLOGIC: Alert and oriented to person, place, and time. Normal reflexes, muscle tone coordination. No cranial nerve deficit noted. PSYCHIATRIC: Normal mood and affect. Normal behavior. Normal judgment and thought content. CARDIOVASCULAR: Normal heart rate noted, regular rhythm RESPIRATORY: Clear to auscultation bilaterally. Effort and breath sounds normal, no problems with respiration noted. BREASTS: Symmetric in size. Right breast  1 cm mass noted at 10 oclock in the axilla region it is tender to touch left breast no masses, skin changes, nipple drainage, or lymphadenopathy. ABDOMEN: Soft, normal bowel sounds, no distention noted.  No tenderness, rebound or guarding.  PELVIC: Normal appearing external genitalia; normal appearing vaginal mucosa and cervix.  No abnormal discharge noted.  Pap smear obtained.  Normal uterine size, no other palpable masses, no uterine or adnexal tenderness.    Assessment and Plan:  Well Women annual GYN exam  Will follow up results of pap smear and manage accordingly. Mammogram diagnostic of right breast ordered for mass Labs: cholesterol, CMP Routine preventative health maintenance measures emphasized. Please refer to After Visit Summary for other counseling recommendations.    Philip Aspen, CNM

## 2018-10-28 NOTE — Addendum Note (Signed)
Addended by: Fermin Schwab L on: 10/28/2018 11:30 AM   Modules accepted: Orders

## 2018-10-28 NOTE — Patient Instructions (Signed)
Preventive Care 18-39 Years, Female Preventive care refers to lifestyle choices and visits with your health care provider that can promote health and wellness. What does preventive care include?   A yearly physical exam. This is also called an annual well check.  Dental exams once or twice a year.  Routine eye exams. Ask your health care provider how often you should have your eyes checked.  Personal lifestyle choices, including: ? Daily care of your teeth and gums. ? Regular physical activity. ? Eating a healthy diet. ? Avoiding tobacco and drug use. ? Limiting alcohol use. ? Practicing safe sex. ? Taking vitamin and mineral supplements as recommended by your health care provider. What happens during an annual well check? The services and screenings done by your health care provider during your annual well check will depend on your age, overall health, lifestyle risk factors, and family history of disease. Counseling Your health care provider may ask you questions about your:  Alcohol use.  Tobacco use.  Drug use.  Emotional well-being.  Home and relationship well-being.  Sexual activity.  Eating habits.  Work and work environment.  Method of birth control.  Menstrual cycle.  Pregnancy history. Screening You may have the following tests or measurements:  Height, weight, and BMI.  Diabetes screening. This is done by checking your blood sugar (glucose) after you have not eaten for a while (fasting).  Blood pressure.  Lipid and cholesterol levels. These may be checked every 5 years starting at age 20.  Skin check.  Hepatitis C blood test.  Hepatitis B blood test.  Sexually transmitted disease (STD) testing.  BRCA-related cancer screening. This may be done if you have a family history of breast, ovarian, tubal, or peritoneal cancers.  Pelvic exam and Pap test. This may be done every 3 years starting at age 21. Starting at age 30, this may be done every 5  years if you have a Pap test in combination with an HPV test. Discuss your test results, treatment options, and if necessary, the need for more tests with your health care provider. Vaccines Your health care provider may recommend certain vaccines, such as:  Influenza vaccine. This is recommended every year.  Tetanus, diphtheria, and acellular pertussis (Tdap, Td) vaccine. You may need a Td booster every 10 years.  Varicella vaccine. You may need this if you have not been vaccinated.  HPV vaccine. If you are 26 or younger, you may need three doses over 6 months.  Measles, mumps, and rubella (MMR) vaccine. You may need at least one dose of MMR. You may also need a second dose.  Pneumococcal 13-valent conjugate (PCV13) vaccine. You may need this if you have certain conditions and were not previously vaccinated.  Pneumococcal polysaccharide (PPSV23) vaccine. You may need one or two doses if you smoke cigarettes or if you have certain conditions.  Meningococcal vaccine. One dose is recommended if you are age 19-21 years and a first-year college student living in a residence hall, or if you have one of several medical conditions. You may also need additional booster doses.  Hepatitis A vaccine. You may need this if you have certain conditions or if you travel or work in places where you may be exposed to hepatitis A.  Hepatitis B vaccine. You may need this if you have certain conditions or if you travel or work in places where you may be exposed to hepatitis B.  Haemophilus influenzae type b (Hib) vaccine. You may need this if you   have certain risk factors. Talk to your health care provider about which screenings and vaccines you need and how often you need them. This information is not intended to replace advice given to you by your health care provider. Make sure you discuss any questions you have with your health care provider. Document Released: 12/05/2001 Document Revised: 05/22/2017  Document Reviewed: 08/10/2015 Elsevier Interactive Patient Education  2019 [employer].

## 2018-10-29 ENCOUNTER — Other Ambulatory Visit: Payer: Self-pay | Admitting: Certified Nurse Midwife

## 2018-10-29 DIAGNOSIS — N6310 Unspecified lump in the right breast, unspecified quadrant: Secondary | ICD-10-CM

## 2018-10-29 LAB — COMPREHENSIVE METABOLIC PANEL
A/G RATIO: 2.2 (ref 1.2–2.2)
ALK PHOS: 86 IU/L (ref 39–117)
ALT: 14 IU/L (ref 0–32)
AST: 19 IU/L (ref 0–40)
Albumin: 4.6 g/dL (ref 3.5–5.5)
BILIRUBIN TOTAL: 0.5 mg/dL (ref 0.0–1.2)
BUN/Creatinine Ratio: 13 (ref 9–23)
BUN: 11 mg/dL (ref 6–20)
CHLORIDE: 98 mmol/L (ref 96–106)
CO2: 24 mmol/L (ref 20–29)
Calcium: 9.6 mg/dL (ref 8.7–10.2)
Creatinine, Ser: 0.83 mg/dL (ref 0.57–1.00)
GFR calc Af Amer: 104 mL/min/{1.73_m2} (ref >59)
GFR calc non Af Amer: 90 mL/min/{1.73_m2} (ref >59)
Globulin, Total: 2.1 g/dL (ref 1.5–4.5)
Glucose: 74 mg/dL (ref 65–99)
POTASSIUM: 3.7 mmol/L (ref 3.5–5.2)
Sodium: 138 mmol/L (ref 134–144)
TOTAL PROTEIN: 6.7 g/dL (ref 6.0–8.5)

## 2018-10-29 LAB — CYTOLOGY - PAP
Diagnosis: NEGATIVE
HPV (WINDOPATH): NOT DETECTED

## 2018-10-29 LAB — LIPID PANEL
CHOL/HDL RATIO: 4.5 ratio — AB (ref 0.0–4.4)
Cholesterol, Total: 221 mg/dL — ABNORMAL HIGH (ref 100–199)
HDL: 49 mg/dL (ref >39)
LDL CALC: 147 mg/dL — AB (ref 0–99)
Triglycerides: 127 mg/dL (ref 0–149)
VLDL CHOLESTEROL CAL: 25 mg/dL (ref 5–40)

## 2018-11-01 ENCOUNTER — Ambulatory Visit
Admission: RE | Admit: 2018-11-01 | Discharge: 2018-11-01 | Disposition: A | Discharge status: Home / Self Care | Payer: 59 | Source: Ambulatory Visit | Attending: Certified Nurse Midwife | Admitting: Certified Nurse Midwife

## 2018-11-01 DIAGNOSIS — R922 Inconclusive mammogram: Secondary | ICD-10-CM | POA: Diagnosis not present

## 2018-11-01 DIAGNOSIS — N6310 Unspecified lump in the right breast, unspecified quadrant: Secondary | ICD-10-CM

## 2018-11-01 DIAGNOSIS — N6311 Unspecified lump in the right breast, upper outer quadrant: Secondary | ICD-10-CM | POA: Diagnosis not present

## 2018-11-04 ENCOUNTER — Other Ambulatory Visit: Payer: Self-pay | Admitting: Certified Nurse Midwife

## 2018-11-04 DIAGNOSIS — N6310 Unspecified lump in the right breast, unspecified quadrant: Secondary | ICD-10-CM

## 2018-11-21 ENCOUNTER — Telehealth: Payer: 59 | Admitting: Family

## 2018-11-21 DIAGNOSIS — J111 Influenza due to unidentified influenza virus with other respiratory manifestations: Secondary | ICD-10-CM | POA: Diagnosis not present

## 2018-11-21 MED ORDER — OSELTAMIVIR PHOSPHATE 75 MG PO CAPS: 75.0000 mg | ORAL_CAPSULE | Freq: Two times a day (BID) | ORAL | 0 refills | Status: AC

## 2018-11-21 MED ORDER — ALBUTEROL SULFATE 108 (90 BASE) MCG/ACT IN AEPB: 2.0000 | INHALATION_SPRAY | Freq: Four times a day (QID) | RESPIRATORY_TRACT | 2 refills | Status: AC | PRN

## 2018-11-21 NOTE — Progress Notes (Signed)
Greater than 5 minutes, yet less than 10 minutes of time have been spent researching, coordinating, and implementing care for this patient today.   Thank you for the details you included in the comment boxes. Those details are very helpful in determining the best course of treatment for you and help [place] to provide the best care.  E visit for Flu like symptoms   We are sorry that you are not feeling well.  Here is how we plan to help! Based on what you have shared with me it looks like you may have a respiratory virus that may be influenza.  Influenza or "the flu" is   an infection caused by a respiratory virus. The flu virus is highly contagious and persons who did not receive their yearly flu vaccination may "catch" the flu from close contact.  We have anti-viral medications to treat the viruses that cause this infection. They are not a "cure" and only shorten the course of the infection. These prescriptions are most effective when they are given within the first 2 days of "flu" symptoms. Antiviral medication are indicated if you have a high risk of complications from the flu. You should  also consider an antiviral medication if you are in close contact with someone who is at risk. These medications can help patients avoid complications from the flu  but have side effects that you should know. Possible side effects from Tamiflu or oseltamivir include nausea, vomiting, diarrhea, dizziness, headaches, eye redness, sleep problems or other respiratory symptoms. You should not take Tamiflu if you have an allergy to oseltamivir or any to the ingredients in Tamiflu.  Based upon your symptoms and potential risk factors I have prescribed Oseltamivir (Tamiflu).  It has been sent to your designated pharmacy.  You will take one 75 mg capsule orally twice a day for the next 5 days.   I have also added an Albuterol inhaler, take 2 puffs every 6 hours as needed for shortness of breath.    ANYONE WHO HAS FLU  SYMPTOMS SHOULD: . Stay home. The flu is highly contagious and going out or to work exposes others! . Be sure to drink plenty of fluids. Water is fine as well as fruit juices, sodas and electrolyte beverages. You may want to stay away from caffeine or alcohol. If you are nauseated, try taking small sips of liquids. How do you know if you are getting enough fluid? Your urine should be a pale yellow or almost colorless. . Get rest. . Taking a steamy shower or using a humidifier may help nasal congestion and ease sore throat pain. Using a saline nasal spray works much the same way. . Cough drops, hard candies and sore throat lozenges may ease your cough. . Line up a caregiver. Have someone check on you regularly.   GET HELP RIGHT AWAY IF: . You cannot keep down liquids or your medications. . You become short of breath . Your fell like you are going to pass out or loose consciousness. . Your symptoms persist after you have completed your treatment plan MAKE SURE YOU   Understand these instructions.  Will watch your condition.  Will get help right away if you are not doing well or get worse.  Your e-visit answers were reviewed by a board certified advanced clinical practitioner to complete your personal care plan.  Depending on the condition, your plan could have included both over the counter or prescription medications.  If there is a problem please reply  once you have received a response from your provider.  Your safety is important to [place].  If you have drug allergies check your prescription carefully.    You can use MyChart to ask questions about today's visit, request a non-urgent call back, or ask for a work or school excuse for 24 hours related to this e-Visit. If it has been greater than 24 hours you will need to follow up with your provider, or enter a new e-Visit to address those concerns.  You will get an e-mail in the next two days asking about your experience.  I hope that your  e-visit has been valuable and will speed your recovery. Thank you for using e-visits.

## 2019-05-05 ENCOUNTER — Ambulatory Visit
Admission: RE | Admit: 2019-05-05 | Discharge: 2019-05-05 | Disposition: A | Discharge status: Home / Self Care | Payer: 59 | Source: Ambulatory Visit | Attending: Certified Nurse Midwife | Admitting: Certified Nurse Midwife

## 2019-05-05 ENCOUNTER — Other Ambulatory Visit: Payer: Self-pay

## 2019-05-05 DIAGNOSIS — N6310 Unspecified lump in the right breast, unspecified quadrant: Secondary | ICD-10-CM | POA: Insufficient documentation

## 2019-05-05 DIAGNOSIS — N6311 Unspecified lump in the right breast, upper outer quadrant: Secondary | ICD-10-CM | POA: Diagnosis not present

## 2019-05-06 ENCOUNTER — Other Ambulatory Visit: Payer: Self-pay | Admitting: Certified Nurse Midwife

## 2020-02-26 ENCOUNTER — Other Ambulatory Visit: Payer: Self-pay | Admitting: Certified Nurse Midwife

## 2020-02-26 DIAGNOSIS — N63 Unspecified lump in unspecified breast: Secondary | ICD-10-CM

## 2021-01-25 ENCOUNTER — Other Ambulatory Visit: Payer: Self-pay

## 2021-01-25 MED ORDER — ONDANSETRON 4 MG PO TBDP
ORAL_TABLET | ORAL | 1 refills | Status: AC
  Filled 2021-01-25: qty 20, 7d supply, fill #0

## 2021-04-27 ENCOUNTER — Telehealth: Payer: 59 | Admitting: Physician Assistant

## 2021-04-27 DIAGNOSIS — B3731 Acute candidiasis of vulva and vagina: Secondary | ICD-10-CM

## 2021-04-27 DIAGNOSIS — B373 Candidiasis of vulva and vagina: Secondary | ICD-10-CM

## 2021-04-28 ENCOUNTER — Other Ambulatory Visit: Payer: Self-pay

## 2021-04-28 MED ORDER — FLUCONAZOLE 150 MG PO TABS
150.0000 mg | ORAL_TABLET | Freq: Once | ORAL | 0 refills | Status: AC
  Filled 2021-04-28: qty 1, 1d supply, fill #0

## 2021-04-28 NOTE — Progress Notes (Addendum)

## 2021-04-28 NOTE — Progress Notes (Signed)
I have spent 5 minutes in review of e-visit questionnaire, review and updating patient chart, medical decision making and response to patient.   Avanti Jetter Cody Lola Czerwonka, PA-C    

## 2021-11-18 ENCOUNTER — Other Ambulatory Visit: Payer: Self-pay | Admitting: Emergency Medicine

## 2021-11-18 DIAGNOSIS — N6310 Unspecified lump in the right breast, unspecified quadrant: Secondary | ICD-10-CM

## 2021-11-18 DIAGNOSIS — Z1231 Encounter for screening mammogram for malignant neoplasm of breast: Secondary | ICD-10-CM

## 2021-12-01 ENCOUNTER — Ambulatory Visit
Admission: RE | Admit: 2021-12-01 | Discharge: 2021-12-01 | Disposition: A | Discharge status: Home / Self Care | Payer: 59 | Source: Ambulatory Visit | Attending: Emergency Medicine | Admitting: Emergency Medicine

## 2021-12-01 ENCOUNTER — Other Ambulatory Visit: Payer: Self-pay

## 2021-12-01 DIAGNOSIS — N6310 Unspecified lump in the right breast, unspecified quadrant: Secondary | ICD-10-CM

## 2021-12-01 DIAGNOSIS — Z1231 Encounter for screening mammogram for malignant neoplasm of breast: Secondary | ICD-10-CM

## 2021-12-01 DIAGNOSIS — R922 Inconclusive mammogram: Secondary | ICD-10-CM | POA: Diagnosis not present

## 2021-12-23 DIAGNOSIS — D869 Sarcoidosis, unspecified: Secondary | ICD-10-CM | POA: Diagnosis not present

## 2021-12-23 DIAGNOSIS — R059 Cough, unspecified: Secondary | ICD-10-CM | POA: Diagnosis not present

## 2023-12-26 DIAGNOSIS — H25012 Cortical age-related cataract, left eye: Secondary | ICD-10-CM | POA: Diagnosis not present

## 2023-12-26 DIAGNOSIS — H524 Presbyopia: Secondary | ICD-10-CM | POA: Diagnosis not present

## 2023-12-26 DIAGNOSIS — H5213 Myopia, bilateral: Secondary | ICD-10-CM | POA: Diagnosis not present

## 2024-04-22 ENCOUNTER — Other Ambulatory Visit: Payer: Self-pay | Admitting: *Deleted

## 2024-04-22 ENCOUNTER — Inpatient Hospital Stay
Admission: RE | Admit: 2024-04-22 | Discharge: 2024-04-22 | Disposition: A | Discharge status: Home / Self Care | Payer: Self-pay | Source: Ambulatory Visit | Attending: Emergency Medicine | Admitting: Emergency Medicine

## 2024-04-22 DIAGNOSIS — Z1231 Encounter for screening mammogram for malignant neoplasm of breast: Secondary | ICD-10-CM

## 2024-04-28 ENCOUNTER — Other Ambulatory Visit: Payer: Self-pay | Admitting: Family

## 2024-04-28 DIAGNOSIS — Z1231 Encounter for screening mammogram for malignant neoplasm of breast: Secondary | ICD-10-CM

## 2024-04-28 DIAGNOSIS — N6315 Unspecified lump in the right breast, overlapping quadrants: Secondary | ICD-10-CM

## 2024-04-28 DIAGNOSIS — N649 Disorder of breast, unspecified: Secondary | ICD-10-CM

## 2024-04-29 ENCOUNTER — Ambulatory Visit
Admission: RE | Admit: 2024-04-29 | Discharge: 2024-04-29 | Disposition: A | Discharge status: Home / Self Care | Source: Ambulatory Visit | Attending: Family | Admitting: Family

## 2024-04-29 ENCOUNTER — Inpatient Hospital Stay
Admission: RE | Admit: 2024-04-29 | Discharge: 2024-04-29 | Discharge status: Home / Self Care | Source: Ambulatory Visit | Attending: Family

## 2024-04-29 DIAGNOSIS — Z1231 Encounter for screening mammogram for malignant neoplasm of breast: Secondary | ICD-10-CM | POA: Insufficient documentation

## 2024-04-29 DIAGNOSIS — N649 Disorder of breast, unspecified: Secondary | ICD-10-CM

## 2024-04-29 DIAGNOSIS — N6315 Unspecified lump in the right breast, overlapping quadrants: Secondary | ICD-10-CM | POA: Diagnosis not present

## 2024-04-29 DIAGNOSIS — R92333 Mammographic heterogeneous density, bilateral breasts: Secondary | ICD-10-CM | POA: Diagnosis not present

## 2024-04-29 DIAGNOSIS — R928 Other abnormal and inconclusive findings on diagnostic imaging of breast: Secondary | ICD-10-CM | POA: Diagnosis not present

## 2024-04-29 DIAGNOSIS — N6325 Unspecified lump in the left breast, overlapping quadrants: Secondary | ICD-10-CM | POA: Diagnosis not present

## 2024-11-26 ENCOUNTER — Ambulatory Visit

## 2024-11-26 DIAGNOSIS — L821 Other seborrheic keratosis: Secondary | ICD-10-CM | POA: Diagnosis not present

## 2024-11-26 DIAGNOSIS — Z1283 Encounter for screening for malignant neoplasm of skin: Secondary | ICD-10-CM | POA: Diagnosis not present

## 2024-11-26 DIAGNOSIS — D2261 Melanocytic nevi of right upper limb, including shoulder: Secondary | ICD-10-CM | POA: Diagnosis not present

## 2024-11-26 DIAGNOSIS — D485 Neoplasm of uncertain behavior of skin: Secondary | ICD-10-CM | POA: Diagnosis not present

## 2024-11-26 DIAGNOSIS — L578 Other skin changes due to chronic exposure to nonionizing radiation: Secondary | ICD-10-CM

## 2024-11-26 DIAGNOSIS — L814 Other melanin hyperpigmentation: Secondary | ICD-10-CM | POA: Diagnosis not present

## 2024-11-26 DIAGNOSIS — L820 Inflamed seborrheic keratosis: Secondary | ICD-10-CM | POA: Diagnosis not present

## 2024-11-26 DIAGNOSIS — D229 Melanocytic nevi, unspecified: Secondary | ICD-10-CM

## 2024-11-26 DIAGNOSIS — D225 Melanocytic nevi of trunk: Secondary | ICD-10-CM

## 2024-11-26 DIAGNOSIS — W908XXA Exposure to other nonionizing radiation, initial encounter: Secondary | ICD-10-CM

## 2024-11-26 DIAGNOSIS — D1801 Hemangioma of skin and subcutaneous tissue: Secondary | ICD-10-CM

## 2024-11-26 DIAGNOSIS — D492 Neoplasm of unspecified behavior of bone, soft tissue, and skin: Secondary | ICD-10-CM

## 2024-11-26 NOTE — Patient Instructions (Addendum)
 The bandage should remain in place until tomorrow. Remove the bandages and clean the wound once daily as follows:  - Wash your hands. Clean the wound gently with soap and water, and then pat dry. Do not rub. Apply a small amount of Petroleum Jelly or Vaseline. Cover the area with a Band-Aid.  A small amount of bleeding is normal. If bleeding persists, apply firm pressure over the bandage for 5 to 10 minutes without interruption. If bleeding continues, call our office. Continue to clean the area as directed above until the wound is healed. Shave biopsies may take several weeks to heal. It is normal if the edges are pink/red and the center is slight yellowish or white in color. However if the site becomes hot, swollen, has a thick drainage or redness that expands away from the site please call us . We will contact you with results once available.    Sunscreen  Who needs sunscreen? Everyone. Sunscreen use can help prevent skin cancer by protecting you from the sun's harmful ultraviolet rays. Anyone can get skin cancer, regardless of age, gender or race. In fact, it is estimated that one in five Americans will develop skin cancer in their lifetime.  Sunscreen alone cannot fully protect you. In addition to wearing sunscreen, dermatologists recommend taking the following steps to protect your skin and find skin cancer early:  Seek shade when appropriate, remembering that the sun's rays are strongest between 10 a.m. and 2 p.m. If your shadow is shorter than you are, seek shade. Dress to protect yourself from the sun by wearing a lightweight long-sleeved shirt, pants, a wide-brimmed hat and sunglasses, when possible.  Use extra caution near water, snow and sand as they reflect the damaging rays of the sun, which can increase your chance of sunburn.  Get vitamin D  safely through a healthy diet that may include vitamin supplements. Don't seek the sun. Avoid tanning beds. Ultraviolet light from the sun and tanning  beds can cause skin cancer and wrinkling. If you want to look tan, you may wish to use a self-tanning product, but continue to use sunscreen with it.  When should I use sunscreen? Every day you go outside--even if you're just walking to and from your form of transportation. The sun emits harmful UV rays year-round. Even on cloudy days, up to 80 percent of the sun's harmful UV rays can penetrate your skin. Snow, sand and water increase the need for sunscreen because they reflect the sun's rays.  How much sunscreen should I use, and how often should I apply it? Most people only apply 25-50 percent of the recommended amount of sunscreen. Apply enough sunscreen to cover all exposed skin. Most adults need about 1 ounce -- or enough to fill a shot glass -- to fully cover their body.  Don't forget to apply to the tops of your feet, your neck, your ears and the top of your head. Apply sunscreen to dry skin 15 minutes before going outdoors.  Skin cancer also can form on the lips. To protect your lips, apply a lip balm or lipstick that contains sunscreen with an SPF of 30 or higher.  When outdoors, reapply sunscreen approximately every two hours, or after swimming or sweating, according to the directions on the bottle.   Broad-spectrum sunscreens protect against both UVA and UVB rays. What is the difference between the rays? Sunlight consists of two types of harmful rays that reach the earth -- UVA rays and UVB rays. Overexposure to either  can lead to skin cancer. In addition to causing skin cancer, here's what each of these rays do:  UVA rays (or aging rays) can prematurely age your skin, causing wrinkles and age spots, and can pass through window glass. UVB rays (or burning rays) are the primary cause of sunburn and are blocked by window glass  There is no safe way to tan. Every time you tan, you damage your skin. As this damage builds, you speed up the aging of your skin and increase your risk for all types  of skin cancer.  What is the difference between chemical and physical sunscreens? Chemical sunscreens work like a sponge, absorbing the sun's rays. They contain one or more of the following active ingredients: oxybenzone, avobenzone, octisalate, octocrylene, homosalate and octinoxate. These formulations tend to be easier to rub into the skin without leaving a white residue.   Physical sunscreens work like a shield, sitting sit on the surface of your skin and deflecting the sun's rays. They contain the active ingredients zinc oxide and/or titanium dioxide. Use this sunscreen if you have sensitive skin.   What type of sunscreen should I use? The best type of sunscreen is the one you will use again and again. Just make sure it offers broad-spectrum (UVA and UVB) protection, has an SPF of 30+, and is water-resistant. The kind of sunscreen you use is a matter of personal choice, and may vary depending on the area of the body to be protected. Available sunscreen options include lotions, creams, gels, ointments, wax sticks and sprays.  Recommended physical sunscreens for face: - Neutrogena Sheer Zinc - Aveeno Positively Mineral Sensitive - CeraVe Hydrating Mineral (also has a tinted version) - La Roche-Posay Anthelios Mineral Face (comes as a cream, lotion, light fluid, and there is also a tinted version).  - EltaMD UV Clear (also has a tinted version)  Recommended physical sunscreens for body: - Neutrogena Sheer Zinc Dry-Touch Sunscreen Sensitive Skin Lotion Broad Spectrum SPF 50 - Aveeno Positively Mineral Sensitive Skin Sunscreen Broad Spectrum SPF 50 - La Roche-Posay Anthelios SPF 50 Mineral Sunscreen - Gentle Lotion - CeraVe Hydrating Mineral Sunscreen SPF 50  Recommended chemical sunscreens for face: - Anthelios UV Correct Face Sunscreen SPF 70 with Niacinamide - Neutrogena Clear Face Oil-Free SPF 50 with Helioplex - Neutrogena Sport Face Oil-Free SPF 70+ with Helioplex - Aveeno Protect +  Hydrate Sunscreen For Face SPF 70 - La Roche-Posay Anthelios Light Fluid Sunscreen for Face SPF 60  Recommended chemical sunscreens for body: - Neutrogena Ultra Sheer Dry-Touch Sunscreen SPF 70 - Aveeno Protect + Hydrate Broad Spectrum All-Day Hydration SPF 60 (comes in a big pump) - La Roche-Posay Anthelios Melt-In Milk Sunscreen SPF 60    Due to recent changes in healthcare laws, you may see results of your pathology and/or laboratory studies on MyChart before the doctors have had a chance to review them. We understand that in some cases there may be results that are confusing or concerning to you. Please understand that not all results are received at the same time and often the doctors may need to interpret multiple results in order to provide you with the best plan of care or course of treatment. Therefore, we ask that you please give us  2 business days to thoroughly review all your results before contacting the office for clarification. Should we see a critical lab result, you will be contacted sooner.   If You Need Anything After Your Visit  If you have any questions or  concerns for your doctor, please call our main line at [phone] and press option 4 to reach your doctor's medical assistant. If no one answers, please leave a voicemail as directed and we will return your call as soon as possible. Messages left after 4 pm will be answered the following business day.   You may also send us  a message via MyChart. We typically respond to MyChart messages within 1-2 business days.  For prescription refills, please ask your pharmacy to contact our office. Our fax number is [phone].  If you have an urgent issue when the clinic is closed that cannot wait until the next business day, you can page your doctor at the number below.    Please note that while we do our best to be available for urgent issues outside of office hours, we are not available 24/7.   If you have an urgent issue  and are unable to reach us , you may choose to seek medical care at your doctor's office, retail clinic, urgent care center, or emergency room.  If you have a medical emergency, please immediately call 911 or go to the emergency department.  Pager Numbers  - Dr. Hester: [phone]  - Dr. Jackquline: [phone]  - Dr. Claudene: [phone]   In the event of inclement weather, please call our main line at [phone] for an update on the status of any delays or closures.  Dermatology Medication Tips: Please keep the boxes that topical medications come in in order to help keep track of the instructions about where and how to use these. Pharmacies typically print the medication instructions only on the boxes and not directly on the medication tubes.   If your medication is too expensive, please contact our office at [phone] option 4 or send us  a message through MyChart.   We are unable to tell what your co-pay for medications will be in advance as this is different depending on your insurance coverage. However, we may be able to find a substitute medication at lower cost or fill out paperwork to get insurance to cover a needed medication.   If a prior authorization is required to get your medication covered by your insurance company, please allow us  1-2 business days to complete this process.  Drug prices often vary depending on where the prescription is filled and some pharmacies may offer cheaper prices.  The website www.goodrx.com contains coupons for medications through different pharmacies. The prices here do not account for what the cost may be with help from insurance (it may be cheaper with your insurance), but the website can give you the price if you did not use any insurance.  - You can print the associated coupon and take it with your prescription to the pharmacy.  - You may also stop by our office during regular business hours and pick up a GoodRx coupon card.  - If you  need your prescription sent electronically to a different pharmacy, notify our office through [facility] or by phone at [phone] option 4.     Si Usted Necesita Algo Despus de Su Visita  Tambin puede enviarnos un mensaje a travs de Clinical cytogeneticist. Por lo general respondemos a los mensajes de MyChart en el transcurso de 1 a 2 das hbiles.  Para renovar recetas, por favor pida a su farmacia que se ponga en contacto con nuestra oficina. Randi lakes de fax es [place] [phone].  Si tiene un asunto urgente cuando la clnica est cerrada y que no puede  esperar hasta el siguiente da hbil, puede llamar/localizar a su doctor(a) al nmero que aparece a continuacin.   Por favor, tenga en cuenta que aunque hacemos todo lo posible para estar disponibles para asuntos urgentes fuera del horario de [place], no estamos disponibles las 24 horas del da, los 7 [address]  [address] de la [place].   Si tiene un problema urgente y no puede comunicarse con nosotros, puede optar por buscar atencin mdica  en el consultorio de su doctor(a), en una clnica privada, en un centro de atencin urgente o en una sala de emergencias.  Si tiene Engineer, drilling, por favor llame inmediatamente al 911 o vaya a la sala de emergencias.  Nmeros de bper  - Dr. Hester: [phone]  - Dra. Jackquline: [phone]  - Dr. Claudene: [phone]   En caso de inclemencias del tiempo, por favor llame a landry capes principal al [phone] para una actualizacin sobre el [place] de cualquier retraso o cierre.  Consejos para la medicacin en dermatologa: Por favor, guarde las cajas en las que vienen los medicamentos de uso tpico para ayudarle a seguir las instrucciones sobre dnde y cmo usarlos. Las farmacias generalmente imprimen las instrucciones del medicamento slo en las cajas y no directamente en los tubos del [place].   Si su medicamento es muy caro, por favor, pngase en contacto con landry rieger llamando al  [phone] y presione la opcin 4 o envenos un mensaje a travs de Clinical cytogeneticist.   No podemos decirle cul ser su copago por los medicamentos por adelantado ya que esto es diferente dependiendo de la cobertura de su seguro. Sin embargo, es posible que podamos encontrar un medicamento sustituto a Audiological scientist un formulario para que el seguro cubra el medicamento que se considera necesario.   Si se requiere una autorizacin previa para que su compaa de seguros malta su medicamento, por favor permtanos de 1 a 2 das hbiles para completar este proceso.  Los precios de los medicamentos varan con frecuencia dependiendo del Environmental consultant de dnde se surte la receta y alguna farmacias pueden ofrecer precios ms baratos.  El sitio web www.goodrx.com tiene cupones para medicamentos de Health and safety inspector. Los precios aqu no tienen en cuenta lo que podra costar con la ayuda del seguro (puede ser ms barato con su seguro), pero el sitio web puede darle el precio si no utiliz Tourist information centre manager.  - Puede imprimir el cupn correspondiente y llevarlo con su receta a la farmacia.  - Tambin puede pasar por nuestra oficina durante el horario de atencin regular y Education officer, museum una tarjeta de cupones de GoodRx.  - Si necesita que su receta se enve electrnicamente a una farmacia diferente, informe a nuestra oficina a travs de MyChart de [place] o por telfono llamando al [phone] y presione la opcin 4.

## 2024-11-26 NOTE — Progress Notes (Signed)
 "   Subjective   Angela Bush is a 44 y.o. female who presents for the following: Total body skin exam for skin cancer screening and mole check. The patient has spots, moles and lesions to be evaluated, some may be new or changing and the patient may have concern these could be cancer. Patient is new patient.  Today patient reports: LOC at right flank side and places at face. No personal hx of skin cancer, possible hx skin cancer both parents.   Review of Systems:    No other skin or systemic complaints except as noted in HPI or Assessment and Plan.  The following portions of the chart were reviewed this encounter and updated as appropriate: medications, allergies, medical history  Relevant Medical History:  Family history of skin cancer - Both parents?   Objective  (SKPE) Well appearing patient in no apparent distress; mood and affect are within normal limits. Examination was performed of the: Full Skin Examination: scalp, head, eyes, ears, nose, lips, neck, chest, axillae, abdomen, back, buttocks, bilateral upper extremities, bilateral lower extremities, hands, feet, fingers, toes, fingernails, and toenails.   Examination notable for: SKIN EXAM, Angioma(s): Scattered red vascular papule(s)  , Lentigo/lentigines: Scattered pigmented macules that are tan to brown in color and are somewhat non-uniform in shape and concentrated in the sun-exposed areas, Nevus/nevi: Scattered well-demarcated, regular, pigmented macule(s) and/or papule(s)  , Seborrheic Keratosis(es): Stuck-on appearing keratotic papule(s) on the trunk, some  irritated with redness, crusting, edema, and/or partial avulsion, Actinic Damage/Elastosis: chronic sun damage: dyspigmentation, telangiectasia, and wrinkling  - 3 mm dark brown papule at right posterior arm. - 3 brown macules in close proximity 7 mm total at right lower mid back. Examination limited by: Undergarments  Right posterior arm 3 mm dark brown  papule  Right lower mid back 3 brown macules in close proximity 7 mm total   Right shin x1 Stuck on waxy paps with erythema  Assessment & Plan  (SKAP)   SKIN CANCER SCREENING PERFORMED TODAY.  BENIGN SKIN FINDINGS  - Lentigines  - Seborrheic keratoses  - Hemangiomas   - Nevus/Multiple Benign Nevi  - Fibrous papules  - Reassurance provided regarding the benign appearance of lesions noted on exam today; no treatment is indicated in the absence of symptoms/changes. - Reinforced importance of photoprotective strategies including liberal and frequent sunscreen use of a broad-spectrum SPF 30 or greater, use of protective clothing, and sun avoidance for prevention of cutaneous malignancy and photoaging.  Counseled patient on the importance of regular self-skin monitoring as well as routine clinical skin examinations as scheduled.   ACTINIC DAMAGE - Chronic condition, secondary to cumulative UV/sun exposure - Recommend daily broad spectrum sunscreen SPF 30+ to sun-exposed areas, reapply every 2 hours as needed.  - Staying in the shade or wearing long sleeves, sun glasses (UVA+UVB protection) and wide brim hats (4-inch brim around the entire circumference of the hat) are also recommended for sun protection.  - Call for new or changing lesions.  Was sun protection counseling provided?: Yes   Level of service outlined above   Patient instructions (SKPI)   Procedures, orders, diagnosis for this visit:  NEOPLASM OF SKIN (2) Right posterior arm - Epidermal / dermal shaving  Lesion diameter (cm):  0.3 Informed consent: discussed and consent obtained   Timeout: patient name, date of birth, surgical site, and procedure verified   Procedure prep:  Patient was prepped and draped in usual sterile fashion Prep type:  Isopropyl alcohol Anesthesia: the  lesion was anesthetized in a standard fashion   Anesthetic:  1% lidocaine  w/ epinephrine 1-100,000 buffered w/ 8.4% NaHCO3 Instrument used:  DermaBlade   Hemostasis achieved with: pressure and aluminum chloride   Outcome: patient tolerated procedure well   Post-procedure details: wound care instructions given    Specimen 1 - Surgical pathology Differential Diagnosis: Dysplastic vs melanoma   Check Margins: No 3 mm dark brown papule Right lower mid back - Epidermal / dermal shaving  Lesion diameter (cm):  0.7 Informed consent: discussed and consent obtained   Timeout: patient name, date of birth, surgical site, and procedure verified   Procedure prep:  Patient was prepped and draped in usual sterile fashion Prep type:  Isopropyl alcohol Anesthesia: the lesion was anesthetized in a standard fashion   Anesthetic:  1% lidocaine  w/ epinephrine 1-100,000 buffered w/ 8.4% NaHCO3 Instrument used: DermaBlade   Hemostasis achieved with: pressure and aluminum chloride   Outcome: patient tolerated procedure well   Post-procedure details: wound care instructions given    Specimen 2 - Surgical pathology Differential Diagnosis: Dysplastic vs melanoma  Check Margins: No 3 brown macules in close proximity 7 mm total INFLAMED SEBORRHEIC KERATOSIS Right shin x1 Symptomatic, irritating, patient would like treated. - Destruction of lesion - Right shin x1 Complexity: simple   Destruction method: cryotherapy   Informed consent: discussed and consent obtained   Timeout:  patient name, date of birth, surgical site, and procedure verified Lesion destroyed using liquid nitrogen: Yes   Region frozen until ice ball extended beyond lesion: Yes   Cryo cycles: 1 or 2. Outcome: patient tolerated procedure well with no complications   Post-procedure details: wound care instructions given   Additional details:  Prior to procedure, discussed risks of blister formation, small wound, skin dyspigmentation, or rare scar following cryotherapy. Recommend Vaseline ointment to treated areas while healing.    Neoplasm of skin -     Epidermal / dermal  shaving -     Surgical pathology; Standing -     Epidermal / dermal shaving  Inflamed seborrheic keratosis -     Destruction of lesion    Return to clinic: Return in about 1 year (around 11/26/2025) for TBSE, w/ Dr. Raymund.  LILLETTE Lonell ROCKFORD Wilfred, CMA, am acting as scribe for Lauraine JAYSON Raymund, MD.  Documentation: I have reviewed the above documentation for accuracy and completeness, and I agree with the above.  Lauraine JAYSON Raymund, MD  "

## 2024-11-27 ENCOUNTER — Encounter: Payer: Self-pay | Admitting: Family

## 2024-11-27 ENCOUNTER — Ambulatory Visit: Payer: Self-pay

## 2024-11-27 LAB — SURGICAL PATHOLOGY

## 2024-11-27 NOTE — Progress Notes (Signed)
 LMTRC

## 2025-11-26 ENCOUNTER — Ambulatory Visit
